# Patient Record
Sex: Female | Born: 1935 | Race: Black or African American | Hispanic: No | Smoking: Never smoker
Health system: Southern US, Community
[De-identification: ages and names within clinical notes are randomized; demographics above are authoritative.]

## PROBLEM LIST (undated history)

## (undated) DIAGNOSIS — E119 Type 2 diabetes mellitus without complications: Secondary | ICD-10-CM

## (undated) DIAGNOSIS — N289 Disorder of kidney and ureter, unspecified: Secondary | ICD-10-CM

## (undated) HISTORY — PX: STOMACH SURGERY: SHX791

## (undated) HISTORY — PX: CARDIAC SURGERY: SHX584

## (undated) HISTORY — DX: Type 2 diabetes mellitus without complications: E11.9

## (undated) HISTORY — DX: Disorder of kidney and ureter, unspecified: N28.9

## (undated) HISTORY — PX: BREAST SURGERY: SHX581

---

## 2003-04-13 ENCOUNTER — Other Ambulatory Visit: Payer: Self-pay

## 2010-04-21 DIAGNOSIS — N184 Chronic kidney disease, stage 4 (severe): Secondary | ICD-10-CM | POA: Insufficient documentation

## 2012-06-29 DIAGNOSIS — E1122 Type 2 diabetes mellitus with diabetic chronic kidney disease: Secondary | ICD-10-CM | POA: Insufficient documentation

## 2012-11-29 ENCOUNTER — Encounter: Payer: Self-pay | Admitting: Podiatry

## 2012-12-01 ENCOUNTER — Encounter: Payer: Self-pay | Admitting: Podiatry

## 2012-12-01 ENCOUNTER — Ambulatory Visit (INDEPENDENT_AMBULATORY_CARE_PROVIDER_SITE_OTHER): Payer: Medicare Other | Admitting: Podiatry

## 2012-12-01 VITALS — BP 154/67 | HR 65 | Resp 16 | Ht 66.0 in | Wt 213.0 lb

## 2012-12-01 DIAGNOSIS — M79609 Pain in unspecified limb: Secondary | ICD-10-CM

## 2012-12-01 DIAGNOSIS — B351 Tinea unguium: Secondary | ICD-10-CM

## 2012-12-01 NOTE — Progress Notes (Signed)
Illyana presents today with a chief complaint of painful E. elongated toenails.  Objective: Pulses remain palpable bilateral nails are thick yellow dystrophic clinically mycotic painful on palpation and ambulation.  Assessment: Pain in limb secondary to onychomycosis 1 through 5 bilateral.  Plan: Debridement of nails 1 through 5 bilateral is cover service secondary to pain followup with her in 3 months.

## 2013-03-02 ENCOUNTER — Ambulatory Visit (INDEPENDENT_AMBULATORY_CARE_PROVIDER_SITE_OTHER): Payer: Medicare Other | Admitting: Podiatry

## 2013-03-02 ENCOUNTER — Encounter: Payer: Self-pay | Admitting: Podiatry

## 2013-03-02 VITALS — BP 165/82 | HR 72 | Resp 16 | Ht 66.0 in | Wt 208.0 lb

## 2013-03-02 DIAGNOSIS — B351 Tinea unguium: Secondary | ICD-10-CM

## 2013-03-02 DIAGNOSIS — M79609 Pain in unspecified limb: Secondary | ICD-10-CM

## 2013-03-03 NOTE — Progress Notes (Signed)
Katrina Chambers presents today with a chief complaint of painful toenails one through 5 bilateral.  Objective: Vital signs are stable alert and oriented x3. Nails are thick yellow dystrophic lytic mycotic painful palpation as well as debridement.  Assessment: Pain in limb secondary to onychomycosis 1 through 5 bilateral.  Plan: Debridement of nails 1 through 5 bilateral is cover service secondary to pain.

## 2013-06-13 ENCOUNTER — Ambulatory Visit (INDEPENDENT_AMBULATORY_CARE_PROVIDER_SITE_OTHER): Payer: Medicare Other | Admitting: Podiatry

## 2013-06-13 ENCOUNTER — Encounter: Payer: Self-pay | Admitting: Podiatry

## 2013-06-13 DIAGNOSIS — B351 Tinea unguium: Secondary | ICD-10-CM

## 2013-06-13 DIAGNOSIS — M79609 Pain in unspecified limb: Secondary | ICD-10-CM

## 2013-06-13 NOTE — Progress Notes (Signed)
She presents today with a chief complaint of painful elongated toenails one through 5 bilateral.  Objective: Pulses are palpable bilateral. Nails are thick yellow dystrophic onychomycotic and painful palpation.  Assessment: Pain in limb secondary to onychomycosis 1 through 5 bilateral.  Plan: Debridement of nails 1 through 5 bilateral covered service secondary to pain.

## 2013-09-05 ENCOUNTER — Ambulatory Visit: Payer: Medicare Other | Admitting: Podiatry

## 2013-09-08 ENCOUNTER — Ambulatory Visit (INDEPENDENT_AMBULATORY_CARE_PROVIDER_SITE_OTHER): Payer: Medicare Other | Admitting: Podiatry

## 2013-09-08 DIAGNOSIS — M79609 Pain in unspecified limb: Secondary | ICD-10-CM

## 2013-09-08 DIAGNOSIS — M79676 Pain in unspecified toe(s): Secondary | ICD-10-CM

## 2013-09-08 DIAGNOSIS — B351 Tinea unguium: Secondary | ICD-10-CM

## 2013-09-08 NOTE — Patient Instructions (Signed)
Diabetes and Foot Care Diabetes may cause you to have problems because of poor blood supply (circulation) to your feet and legs. This may cause the skin on your feet to become thinner, break easier, and heal more slowly. Your skin may become dry, and the skin may peel and crack. You may also have nerve damage in your legs and feet causing decreased feeling in them. You may not notice minor injuries to your feet that could lead to infections or more serious problems. Taking care of your feet is one of the most important things you can do for yourself.  HOME CARE INSTRUCTIONS  Wear shoes at all times, even in the house. Do not go barefoot. Bare feet are easily injured.  Check your feet daily for blisters, cuts, and redness. If you cannot see the bottom of your feet, use a mirror or ask someone for help.  Wash your feet with warm water (do not use hot water) and mild soap. Then pat your feet and the areas between your toes until they are completely dry. Do not soak your feet as this can dry your skin.  Apply a moisturizing lotion or petroleum jelly (that does not contain alcohol and is unscented) to the skin on your feet and to dry, brittle toenails. Do not apply lotion between your toes.  Trim your toenails straight across. Do not dig under them or around the cuticle. File the edges of your nails with an emery board or nail file.  Do not cut corns or calluses or try to remove them with medicine.  Wear clean socks or stockings every day. Make sure they are not too tight. Do not wear knee-high stockings since they may decrease blood flow to your legs.  Wear shoes that fit properly and have enough cushioning. To break in new shoes, wear them for just a few hours a day. This prevents you from injuring your feet. Always look in your shoes before you put them on to be sure there are no objects inside.  Do not cross your legs. This may decrease the blood flow to your feet.  If you find a minor scrape,  cut, or break in the skin on your feet, keep it and the skin around it clean and dry. These areas may be cleansed with mild soap and water. Do not cleanse the area with peroxide, alcohol, or iodine.  When you remove an adhesive bandage, be sure not to damage the skin around it.  If you have a wound, look at it several times a day to make sure it is healing.  Do not use heating pads or hot water bottles. They may burn your skin. If you have lost feeling in your feet or legs, you may not know it is happening until it is too late.  Make sure your health care provider performs a complete foot exam at least annually or more often if you have foot problems. Report any cuts, sores, or bruises to your health care provider immediately. SEEK MEDICAL CARE IF:   You have an injury that is not healing.  You have cuts or breaks in the skin.  You have an ingrown nail.  You notice redness on your legs or feet.  You feel burning or tingling in your legs or feet.  You have pain or cramps in your legs and feet.  Your legs or feet are numb.  Your feet always feel cold. SEEK IMMEDIATE MEDICAL CARE IF:   There is increasing redness,   swelling, or pain in or around a wound.  There is a red line that goes up your leg.  Pus is coming from a wound.  You develop a fever or as directed by your health care provider.  You notice a bad smell coming from an ulcer or wound. Document Released: 12/21/1999 Document Revised: 08/25/2012 Document Reviewed: 06/01/2012 Exit[employer] Patient Information 2015 [employer]. This information is not intended to replace advice given to you by your health care provider. Make sure you discuss any questions you have with your health care provider.  

## 2013-09-08 NOTE — Progress Notes (Signed)
Patient ID: TRISH HETU, female   DOB: 25-Apr-1934, 79 y.o.   MRN: [id number]  Subjective: 79 year old female returns the office today for diabetic risk assessment and for painful elongated nails. She denies any tingling or numbness her feet, denies claudication symptoms, nice any recent ulceration. States that her last blood sugar was 128 this morning. No other complaints at this time.  Objective: AAO x3, NAD DP/PT pulses palpable b/l. CRT < 3sec Protective sensation intact the Semmes Weinstein monofilament, vibratory sensation intact, Achilles tendon reflex intact. Nails hypertrophic, dystrophic, elongated, discolored x10.  No open lesions, no pre-ulcer lesions. ROM WNL  Assessment: 79 year old female with symptomatic onychomycosis  Plan: -Treatment options discussed with the patient including alternatives, risks, complications. -Nails are sharply debrided [id number] without complications. -Discussed the importance of daily foot inspection. -Followup in 3 months or sooner if any problems are to arise or any changes symptoms. Call with any questions or concerns.

## 2013-12-08 ENCOUNTER — Ambulatory Visit (INDEPENDENT_AMBULATORY_CARE_PROVIDER_SITE_OTHER): Payer: Medicare Other | Admitting: Podiatry

## 2013-12-08 DIAGNOSIS — M79676 Pain in unspecified toe(s): Secondary | ICD-10-CM

## 2013-12-08 DIAGNOSIS — B351 Tinea unguium: Secondary | ICD-10-CM

## 2013-12-08 NOTE — Patient Instructions (Signed)
Diabetes and Foot Care Diabetes may cause you to have problems because of poor blood supply (circulation) to your feet and legs. This may cause the skin on your feet to become thinner, break easier, and heal more slowly. Your skin may become dry, and the skin may peel and crack. You may also have nerve damage in your legs and feet causing decreased feeling in them. You may not notice minor injuries to your feet that could lead to infections or more serious problems. Taking care of your feet is one of the most important things you can do for yourself.  HOME CARE INSTRUCTIONS  Wear shoes at all times, even in the house. Do not go barefoot. Bare feet are easily injured.  Check your feet daily for blisters, cuts, and redness. If you cannot see the bottom of your feet, use a mirror or ask someone for help.  Wash your feet with warm water (do not use hot water) and mild soap. Then pat your feet and the areas between your toes until they are completely dry. Do not soak your feet as this can dry your skin.  Apply a moisturizing lotion or petroleum jelly (that does not contain alcohol and is unscented) to the skin on your feet and to dry, brittle toenails. Do not apply lotion between your toes.  Trim your toenails straight across. Do not dig under them or around the cuticle. File the edges of your nails with an emery board or nail file.  Do not cut corns or calluses or try to remove them with medicine.  Wear clean socks or stockings every day. Make sure they are not too tight. Do not wear knee-high stockings since they may decrease blood flow to your legs.  Wear shoes that fit properly and have enough cushioning. To break in new shoes, wear them for just a few hours a day. This prevents you from injuring your feet. Always look in your shoes before you put them on to be sure there are no objects inside.  Do not cross your legs. This may decrease the blood flow to your feet.  If you find a minor scrape,  cut, or break in the skin on your feet, keep it and the skin around it clean and dry. These areas may be cleansed with mild soap and water. Do not cleanse the area with peroxide, alcohol, or iodine.  When you remove an adhesive bandage, be sure not to damage the skin around it.  If you have a wound, look at it several times a day to make sure it is healing.  Do not use heating pads or hot water bottles. They may burn your skin. If you have lost feeling in your feet or legs, you may not know it is happening until it is too late.  Make sure your health care provider performs a complete foot exam at least annually or more often if you have foot problems. Report any cuts, sores, or bruises to your health care provider immediately. SEEK MEDICAL CARE IF:   You have an injury that is not healing.  You have cuts or breaks in the skin.  You have an ingrown nail.  You notice redness on your legs or feet.  You feel burning or tingling in your legs or feet.  You have pain or cramps in your legs and feet.  Your legs or feet are numb.  Your feet always feel cold. SEEK IMMEDIATE MEDICAL CARE IF:   There is increasing redness,   swelling, or pain in or around a wound.  There is a red line that goes up your leg.  Pus is coming from a wound.  You develop a fever or as directed by your health care provider.  You notice a bad smell coming from an ulcer or wound. Document Released: 12/21/1999 Document Revised: 08/25/2012 Document Reviewed: 06/01/2012 Exit[employer] Patient Information 2015 [employer]. This information is not intended to replace advice given to you by your health care provider. Make sure you discuss any questions you have with your health care provider.  

## 2013-12-08 NOTE — Progress Notes (Signed)
Patient ID: Katrina Chambers, female   DOB: 02/26/1934, 79 y.o.   MRN: [id number]  Subjective: 79 year old female returns the office today for diabetic risk assessment and for painful elongated toenails. She states the nails are painful particularly when wearing shoe gear. She does not number what her last blood sugar was. Denies any recent ulcerations. Denies any claudication symptoms or any tingling or numbness. No other complaints at this time. Denies any systemic complaints as fevers, chills, nausea, vomiting. No acute changes his last appointment.  Objective: AAO 3, NAD DP/PT pulses palpable bilaterally, CRT less than 3 seconds Protective sensation intact with Simms Weinstein monofilament, vibratory sensation intact, Achilles tendon reflex intact. Nails hypertrophic, dystrophic, elongated, brittle, discolored 10. There is no surrounding erythema or drainage. No clinical signs of infection. No open lesions or pre-ulcerative lesions. MMT 5/5, ROM WNL No pain with calf compression, swelling, warmth, erythema.  Assessment: 79 year old female with symptomatic onychomycosis.  Plan: -Treatment options were discussed including alternatives, risks, complications. -Nail sharply debrided 10 without complications. -Discussed the importance of daily foot inspection and call with any changes. -Follow-up in 3 months or sooner if any palms are to arise. In the meantime, call the office with any questions, concerns, change in symptoms.

## 2014-03-09 ENCOUNTER — Ambulatory Visit (INDEPENDENT_AMBULATORY_CARE_PROVIDER_SITE_OTHER): Payer: Medicare Other | Admitting: Podiatry

## 2014-03-09 DIAGNOSIS — B351 Tinea unguium: Secondary | ICD-10-CM

## 2014-03-09 DIAGNOSIS — M79676 Pain in unspecified toe(s): Secondary | ICD-10-CM

## 2014-03-09 NOTE — Progress Notes (Signed)
Patient ID: MAYAROSE BERRETTA, female   DOB: 02-Jan-1935, 80 y.o.   MRN: [id number]  Subjective: 80 y.o.-year-old female returns the office today for painful, elongated, thickened toenails. Denies any redness or drainage around the nails. Denies any acute changes since last appointment and no new complaints today. Denies any systemic complaints such as fevers, chills, nausea, vomiting.   Objective: AAO 3, NAD DP/PT pulses palpable, CRT less than 3 seconds Protective sensation intact with Simms Weinstein monofilament, Achilles tendon reflex intact.  Nails hypertrophic, dystrophic, elongated, brittle, discolored 10. There is tenderness to the nails 1-5 bilaterally. There is no surrounding erythema or drainage along the nail sites. No open lesions or pre-ulcerative lesions are identified. No other areas of tenderness bilateral lower extremities. No overlying edema, erythema, increased warmth. No pain with calf compression, swelling, warmth, erythema.  Assessment: Ms. Dai  presents with symptomatic onychomycosis  Plan: -Treatment options including alternatives, risks, complications were discussed -Nails sharply debrided 10 without complication/bleeding. -Discussed daily foot inspection. If there are any changes, to call the office immediately.  -Follow-up in 3 months or sooner if any problems are to arise. In the meantime, encouraged to call the office with any questions, concerns, changes symptoms.

## 2014-03-09 NOTE — Patient Instructions (Signed)
Diabetes and Foot Care Diabetes may cause you to have problems because of poor blood supply (circulation) to your feet and legs. This may cause the skin on your feet to become thinner, break easier, and heal more slowly. Your skin may become dry, and the skin may peel and crack. You may also have nerve damage in your legs and feet causing decreased feeling in them. You may not notice minor injuries to your feet that could lead to infections or more serious problems. Taking care of your feet is one of the most important things you can do for yourself.  HOME CARE INSTRUCTIONS  Wear shoes at all times, even in the house. Do not go barefoot. Bare feet are easily injured.  Check your feet daily for blisters, cuts, and redness. If you cannot see the bottom of your feet, use a mirror or ask someone for help.  Wash your feet with warm water (do not use hot water) and mild soap. Then pat your feet and the areas between your toes until they are completely dry. Do not soak your feet as this can dry your skin.  Apply a moisturizing lotion or petroleum jelly (that does not contain alcohol and is unscented) to the skin on your feet and to dry, brittle toenails. Do not apply lotion between your toes.  Trim your toenails straight across. Do not dig under them or around the cuticle. File the edges of your nails with an emery board or nail file.  Do not cut corns or calluses or try to remove them with medicine.  Wear clean socks or stockings every day. Make sure they are not too tight. Do not wear knee-high stockings since they may decrease blood flow to your legs.  Wear shoes that fit properly and have enough cushioning. To break in new shoes, wear them for just a few hours a day. This prevents you from injuring your feet. Always look in your shoes before you put them on to be sure there are no objects inside.  Do not cross your legs. This may decrease the blood flow to your feet.  If you find a minor scrape,  cut, or break in the skin on your feet, keep it and the skin around it clean and dry. These areas may be cleansed with mild soap and water. Do not cleanse the area with peroxide, alcohol, or iodine.  When you remove an adhesive bandage, be sure not to damage the skin around it.  If you have a wound, look at it several times a day to make sure it is healing.  Do not use heating pads or hot water bottles. They may burn your skin. If you have lost feeling in your feet or legs, you may not know it is happening until it is too late.  Make sure your health care provider performs a complete foot exam at least annually or more often if you have foot problems. Report any cuts, sores, or bruises to your health care provider immediately. SEEK MEDICAL CARE IF:   You have an injury that is not healing.  You have cuts or breaks in the skin.  You have an ingrown nail.  You notice redness on your legs or feet.  You feel burning or tingling in your legs or feet.  You have pain or cramps in your legs and feet.  Your legs or feet are numb.  Your feet always feel cold. SEEK IMMEDIATE MEDICAL CARE IF:   There is increasing redness,   swelling, or pain in or around a wound.  There is a red line that goes up your leg.  Pus is coming from a wound.  You develop a fever or as directed by your health care provider.  You notice a bad smell coming from an ulcer or wound. Document Released: 12/21/1999 Document Revised: 08/25/2012 Document Reviewed: 06/01/2012 Exit[employer] Patient Information 2015 [employer]. This information is not intended to replace advice given to you by your health care provider. Make sure you discuss any questions you have with your health care provider.  

## 2014-06-13 ENCOUNTER — Encounter: Payer: Self-pay | Admitting: Podiatry

## 2014-06-13 ENCOUNTER — Ambulatory Visit (INDEPENDENT_AMBULATORY_CARE_PROVIDER_SITE_OTHER): Payer: Medicare Other | Admitting: Podiatry

## 2014-06-13 DIAGNOSIS — M79676 Pain in unspecified toe(s): Secondary | ICD-10-CM | POA: Diagnosis not present

## 2014-06-13 DIAGNOSIS — B351 Tinea unguium: Secondary | ICD-10-CM

## 2014-06-13 NOTE — Progress Notes (Signed)
Patient ID: ODELIA SIGUR, female   DOB: Mar 08, 1934, 80 y.o.   MRN: [id number]  Subjective: 80 y.o.-year-old female returns the office today for painful, elongated, thickened toenails which she is unable to trim herself. Denies any redness or drainage around the nails. Denies any acute changes since last appointment and no new complaints today. Denies any systemic complaints such as fevers, chills, nausea, vomiting.   Objective: AAO 3, NAD DP/PT pulses palpable, CRT less than 3 seconds Protective sensation intact with Simms Weinstein monofilament, Achilles tendon reflex intact.  Nails hypertrophic, dystrophic, elongated, brittle, discolored 10. There is tenderness overlying these nails. There is no surrounding erythema or drainage along the nail sites. No open lesions or pre-ulcerative lesions are identified. No other areas of tenderness bilateral lower extremities. No overlying edema, erythema, increased warmth. No pain with calf compression, swelling, warmth, erythema.  Assessment: Patient presents with symptomatic onychomycosis  Plan: -Treatment options including alternatives, risks, complications were discussed -Nails sharply debrided 10 without complication/bleeding. -Discussed daily foot inspection. If there are any changes, to call the office immediately.  -Follow-up in 3 months or sooner if any problems are to arise. In the meantime, encouraged to call the office with any questions, concerns, changes symptoms.

## 2014-07-18 DIAGNOSIS — I25810 Atherosclerosis of coronary artery bypass graft(s) without angina pectoris: Secondary | ICD-10-CM | POA: Insufficient documentation

## 2014-09-14 ENCOUNTER — Ambulatory Visit: Payer: Medicare Other

## 2014-09-26 ENCOUNTER — Ambulatory Visit: Payer: Medicare Other

## 2014-10-05 ENCOUNTER — Ambulatory Visit (INDEPENDENT_AMBULATORY_CARE_PROVIDER_SITE_OTHER): Payer: Medicare Other | Admitting: Podiatry

## 2014-10-05 DIAGNOSIS — M79676 Pain in unspecified toe(s): Secondary | ICD-10-CM

## 2014-10-05 DIAGNOSIS — B351 Tinea unguium: Secondary | ICD-10-CM | POA: Diagnosis not present

## 2014-10-05 NOTE — Progress Notes (Signed)
Patient ID: Katrina Chambers, female   DOB: Jul 20, 1934, 80 y.o.   MRN: [id number]  Subjective: 80 y.o.-year-old female returns the office today for painful, elongated, thickened toenails which she is unable to trim herself. Denies any redness or drainage around the nails. Denies any acute changes since last appointment and no new complaints today. Denies any systemic complaints such as fevers, chills, nausea, vomiting.   Objective: AAO 3, NAD DP/PT pulses palpable, CRT less than 3 seconds Nails hypertrophic, dystrophic, elongated, brittle, discolored 10. There is tenderness overlying the nails 15 bilaterally. There is no surrounding erythema or drainage along the nail sites. No open lesions or pre-ulcerative lesions are identified. No other areas of tenderness bilateral lower extremities. No overlying edema, erythema, increased warmth. No pain with calf compression, swelling, warmth, erythema.  Assessment: Patient presents with symptomatic onychomycosis  Plan: -Treatment options including alternatives, risks, complications were discussed -Nails sharply debrided 10 without complication/bleeding. -Discussed daily foot inspection. If there are any changes, to call the office immediately.  -Follow-up in 3 months or sooner if any problems are to arise. In the meantime, encouraged to call the office with any questions, concerns, changes symptoms.  Celesta Gentile, DPM

## 2015-01-04 ENCOUNTER — Ambulatory Visit: Payer: Medicare Other

## 2015-01-05 ENCOUNTER — Ambulatory Visit (INDEPENDENT_AMBULATORY_CARE_PROVIDER_SITE_OTHER): Payer: Medicare Other | Admitting: Sports Medicine

## 2015-01-05 ENCOUNTER — Encounter: Payer: Self-pay | Admitting: Sports Medicine

## 2015-01-05 DIAGNOSIS — B351 Tinea unguium: Secondary | ICD-10-CM | POA: Diagnosis not present

## 2015-01-05 DIAGNOSIS — E119 Type 2 diabetes mellitus without complications: Secondary | ICD-10-CM

## 2015-01-05 DIAGNOSIS — M79676 Pain in unspecified toe(s): Secondary | ICD-10-CM | POA: Diagnosis not present

## 2015-01-06 NOTE — Progress Notes (Signed)
Patient ID: Katrina Chambers, female   DOB: [birth year], 80 y.o.   MRN: [id number] Subjective: Katrina Chambers is a 80 y.o. female patient with history of type 2 diabetes who presents to office today complaining of long, painful nails  while ambulating in shoes; unable to trim. Patient states that the glucose reading this morning was 138 mg/dl. Patient denies any new changes in medication or new problems. Patient denies any new cramping, numbness, burning or tingling in the legs.  There are no active problems to display for this patient.  Current Outpatient Prescriptions on File Prior to Visit  Medication Sig Dispense Refill  . albuterol (PROVENTIL HFA;VENTOLIN HFA) 108 (90 BASE) MCG/ACT inhaler Inhale 2 puffs into the lungs every 6 (six) hours as needed for wheezing or shortness of breath.    Marland Kitchen aspirin 81 MG tablet Take 81 mg by mouth daily.    . enalapril (VASOTEC) 20 MG tablet Take 20 mg by mouth 2 (two) times daily.    . ergocalciferol (VITAMIN D2) 50000 UNITS capsule Take 50,000 Units by mouth once a week.    . felodipine (PLENDIL) 10 MG 24 hr tablet Take 10 mg by mouth daily.    . Fluticasone-Salmeterol (ADVAIR) 250-50 MCG/DOSE AEPB Inhale 1 puff into the lungs 2 (two) times daily.    . hydrALAZINE (APRESOLINE) 10 MG tablet Take 10 mg by mouth 3 (three) times daily.    . insulin glargine (LANTUS) 100 UNIT/ML injection Inject 15 Units into the skin at bedtime.    . metoprolol tartrate (LOPRESSOR) 25 MG tablet Take 25 mg by mouth 2 (two) times daily.    Marland Kitchen NOVOLOG FLEXPEN 100 UNIT/ML FlexPen     . sevelamer (RENAGEL) 800 MG tablet Take 800 mg by mouth 3 (three) times daily with meals.    . simvastatin (ZOCOR) 20 MG tablet Take 20 mg by mouth daily.    Marland Kitchen spironolactone (ALDACTONE) 25 MG tablet Take 25 mg by mouth daily.    . SYMBICORT 160-4.5 MCG/ACT inhaler      No current facility-administered medications on file prior to visit.   No Known Allergies  Labs: HEMOGLOBIN A1C- No recent lab    Objective: General: Patient is awake, alert, and oriented x 3 and in no acute distress.  Integument: Skin is warm, dry and supple bilateral. Nails are tender, long, thickened and  dystrophic with subungual debris, consistent with onychomycosis, 1-5 bilateral. No signs of infection. No open lesions or preulcerative lesions present bilateral. Remaining integument unremarkable.  Vasculature:  Dorsalis Pedis pulse 2/4 bilateral. Posterior Tibial pulse 1/4 bilateral.  Capillary fill time <3 sec 1-5 bilateral. Scant hair growth to the level of the digits. Temperature gradient within normal limits. No varicosities present bilateral. No edema present bilateral.   Neurology: The patient has intact sensation measured with a 5.07/10g Semmes Weinstein Monofilament at all pedal sites bilateral . Vibratory sensation intact bilateral with tuning fork. No Babinski sign present bilateral.   Musculoskeletal: No gross pedal deformities noted bilateral. Muscular strength 5/5 in all lower extremity muscular groups bilateral without pain or limitation on range of motion . No tenderness with calf compression bilateral.  Assessment and Plan: Problem List Items Addressed This Visit    None    Visit Diagnoses    Pain of toe, unspecified laterality    -  Primary    Dermatophytosis of nail        Diabetes mellitus without complication ([place])          -  Examined patient. -Discussed and educated patient on diabetic foot care, especially with  regards to the vascular, neurological and musculoskeletal systems.  -Stressed the importance of good glycemic control and the detriment of not  controlling glucose levels in relation to the foot. -Mechanically debrided all nails 1-5 bilateral using sterile nail nipper and filed with dremel without incident  -Answered all patient questions -Patient to return in 3 months for at risk foot care -Patient advised to call the office if any problems or questions arise in the  meantime.  Katrina Chambers, DPM

## 2015-01-14 ENCOUNTER — Emergency Department
Admission: EM | Admit: 2015-01-14 | Discharge: 2015-01-14 | Disposition: A | Discharge status: Home / Self Care | Payer: Medicare Other | Attending: Emergency Medicine | Admitting: Emergency Medicine

## 2015-01-14 ENCOUNTER — Encounter: Payer: Self-pay | Admitting: Emergency Medicine

## 2015-01-14 DIAGNOSIS — S3992XA Unspecified injury of lower back, initial encounter: Secondary | ICD-10-CM | POA: Diagnosis not present

## 2015-01-14 DIAGNOSIS — Z79899 Other long term (current) drug therapy: Secondary | ICD-10-CM | POA: Diagnosis not present

## 2015-01-14 DIAGNOSIS — M79604 Pain in right leg: Secondary | ICD-10-CM

## 2015-01-14 DIAGNOSIS — Y998 Other external cause status: Secondary | ICD-10-CM | POA: Insufficient documentation

## 2015-01-14 DIAGNOSIS — S8991XA Unspecified injury of right lower leg, initial encounter: Secondary | ICD-10-CM | POA: Diagnosis present

## 2015-01-14 DIAGNOSIS — Y9289 Other specified places as the place of occurrence of the external cause: Secondary | ICD-10-CM | POA: Diagnosis not present

## 2015-01-14 DIAGNOSIS — Z7982 Long term (current) use of aspirin: Secondary | ICD-10-CM | POA: Diagnosis not present

## 2015-01-14 DIAGNOSIS — Z794 Long term (current) use of insulin: Secondary | ICD-10-CM | POA: Insufficient documentation

## 2015-01-14 DIAGNOSIS — E119 Type 2 diabetes mellitus without complications: Secondary | ICD-10-CM | POA: Insufficient documentation

## 2015-01-14 DIAGNOSIS — W010XXA Fall on same level from slipping, tripping and stumbling without subsequent striking against object, initial encounter: Secondary | ICD-10-CM | POA: Diagnosis not present

## 2015-01-14 DIAGNOSIS — Y9389 Activity, other specified: Secondary | ICD-10-CM | POA: Diagnosis not present

## 2015-01-14 DIAGNOSIS — Z7951 Long term (current) use of inhaled steroids: Secondary | ICD-10-CM | POA: Diagnosis not present

## 2015-01-14 DIAGNOSIS — M549 Dorsalgia, unspecified: Secondary | ICD-10-CM

## 2015-01-14 MED ORDER — NAPROXEN 500 MG PO TABS: 500.0000 mg | ORAL_TABLET | Freq: Two times a day (BID) | ORAL | Status: DC

## 2015-01-14 MED ORDER — DIAZEPAM 2 MG PO TABS: 2.0000 mg | ORAL_TABLET | Freq: Three times a day (TID) | ORAL | Status: AC | PRN

## 2015-01-14 NOTE — ED Notes (Signed)
E-signature box not working. Pt verbalized understanding of discharge instructions.

## 2015-01-14 NOTE — ED Notes (Signed)
Friend dropped off the pt - call dorothy poteat at [phone] to pick her up

## 2015-01-14 NOTE — ED Provider Notes (Signed)
[facility] Emergency Department Provider Note  ____________________________________________  Time seen: 3:45 PM  I have reviewed the triage vital signs and the nursing notes.   HISTORY  Chief Complaint Knee Pain    HPI Katrina Chambers is a 81 y.o. female who reports a mechanical fall 1 week ago where she slipped and fell on her back. Denies any pain at that time and no injuries. No head injuries or loss of consciousness, she's been ambulatory without difficulty since then but 2 days ago had gradual onset of right hip pain.     Past Medical History  Diagnosis Date  . Kidney disease   . Diabetes ([place])      There are no active problems to display for this patient.    Past Surgical History  Procedure Laterality Date  . Cardiac surgery    . Breast surgery Right   . Stomach surgery       Current Outpatient Rx  Name  Route  Sig  Dispense  Refill  . albuterol (PROVENTIL HFA;VENTOLIN HFA) 108 (90 BASE) MCG/ACT inhaler   Inhalation   Inhale 2 puffs into the lungs every 6 (six) hours as needed for wheezing or shortness of breath.         Marland Kitchen aspirin 81 MG tablet   Oral   Take 81 mg by mouth daily.         . diazepam (VALIUM) 2 MG tablet   Oral   Take 1 tablet (2 mg total) by mouth every 8 (eight) hours as needed for anxiety.   6 tablet   0   . enalapril (VASOTEC) 20 MG tablet   Oral   Take 20 mg by mouth 2 (two) times daily.         . ergocalciferol (VITAMIN D2) 50000 UNITS capsule   Oral   Take 50,000 Units by mouth once a week.         . felodipine (PLENDIL) 10 MG 24 hr tablet   Oral   Take 10 mg by mouth daily.         . Fluticasone-Salmeterol (ADVAIR) 250-50 MCG/DOSE AEPB   Inhalation   Inhale 1 puff into the lungs 2 (two) times daily.         . hydrALAZINE (APRESOLINE) 10 MG tablet   Oral   Take 10 mg by mouth 3 (three) times daily.         . insulin glargine (LANTUS) 100 UNIT/ML injection   Subcutaneous  Inject 15 Units into the skin at bedtime.         . metoprolol tartrate (LOPRESSOR) 25 MG tablet   Oral   Take 25 mg by mouth 2 (two) times daily.         . naproxen (NAPROSYN) 500 MG tablet   Oral   Take 1 tablet (500 mg total) by mouth 2 (two) times daily with a meal.   20 tablet   0   . NOVOLOG FLEXPEN 100 UNIT/ML FlexPen                 Dispense as written.   . sevelamer (RENAGEL) 800 MG tablet   Oral   Take 800 mg by mouth 3 (three) times daily with meals.         . simvastatin (ZOCOR) 20 MG tablet   Oral   Take 20 mg by mouth daily.         Marland Kitchen spironolactone (ALDACTONE) 25 MG tablet   Oral  Take 25 mg by mouth daily.         Dellis Anes 160-4.5 MCG/ACT inhaler                 Dispense as written.      Allergies Review of patient's allergies indicates no known allergies.   History reviewed. No pertinent family history.  Social History Social History  Substance Use Topics  . Smoking status: Never Smoker   . Smokeless tobacco: Never Used  . Alcohol Use: No    Review of Systems  Constitutional:   No fever or chills. No weight changes Eyes:   No blurry vision or double vision.  ENT:   No sore throat. Cardiovascular:   No chest pain. Respiratory:   No dyspnea or cough. Gastrointestinal:   Negative for abdominal pain, vomiting and diarrhea.  No BRBPR or melena. Genitourinary:   Negative for dysuria, urinary retention, bloody urine, or difficulty urinating. Musculoskeletal:   Negative for back pain. Pain in right hip Skin:   Negative for rash. Neurological:   Negative for headaches, focal weakness or numbness. Psychiatric:  No anxiety or depression.   Endocrine:  No hot/cold intolerance, changes in energy, or sleep difficulty.  10-point ROS otherwise negative.  ____________________________________________   PHYSICAL EXAM:  VITAL SIGNS: ED Triage Vitals  Enc Vitals Group     BP 01/14/15 1535 182/79 mmHg     Pulse --       Resp 01/14/15 1532 18     Temp 01/14/15 1532 98.1 F (36.7 C)     Temp src --      SpO2 01/14/15 1532 96 %     Weight 01/14/15 1532 202 lb (91.627 kg)     Height 01/14/15 1532 5\' 6"  (1.676 m)     Head Cir --      Peak Flow --      Pain Score 01/14/15 1542 4     Pain Loc --      Pain Edu? --      Excl. in [place]? --     Vital signs reviewed, nursing assessments reviewed.   Constitutional:   Alert and oriented. Well appearing and in no distress. Eyes:   No scleral icterus. No conjunctival pallor. PERRL. EOMI ENT   Head:   Normocephalic and atraumatic.   Nose:   No congestion/rhinnorhea. No septal hematoma   Mouth/Throat:   MMM, no pharyngeal erythema. No peritonsillar mass. No uvula shift.   Neck:   No stridor. No SubQ emphysema. No meningismus. Hematological/Lymphatic/Immunilogical:   No cervical lymphadenopathy. Cardiovascular:   RRR. Normal and symmetric distal pulses are present in all extremities. No murmurs, rubs, or gallops. Respiratory:   Normal respiratory effort without tachypnea nor retractions. Breath sounds are clear and equal bilaterally. No wheezes/rales/rhonchi. Gastrointestinal:   Soft and nontender. No distention. There is no CVA tenderness.  No rebound, rigidity, or guarding. Genitourinary:   deferred Musculoskeletal:   Nontender with normal range of motion in all extremities. No joint effusions.  No lower extremity tenderness.  No edema. No midline spinal tenderness. Straight leg raise negative bilaterally. There is tenderness in the soft tissues of the right lower back over the sacroiliac joint area without any instability or bony point tenderness. No pain with hip flexion or knee flexion. No instability, no tenderness of the bones. Neurologic:   Normal speech and language.  CN 2-10 normal. Motor grossly intact. No pronator drift.  Normal gait. No gross focal neurologic deficits are appreciated.  Skin:  Skin is warm, dry and intact. No rash noted.  No  petechiae, purpura, or bullae. Psychiatric:   Mood and affect are normal. Speech and behavior are normal. Patient exhibits appropriate insight and judgment.  ____________________________________________    LABS (pertinent positives/negatives) (all labs ordered are listed, but only abnormal results are displayed) Labs Reviewed - No data to display ____________________________________________   EKG    ____________________________________________    RADIOLOGY  Not indicated  ____________________________________________   PROCEDURES   ____________________________________________   INITIAL IMPRESSION / ASSESSMENT AND PLAN / ED COURSE  Pertinent labs & imaging results that were available during my care of the patient were reviewed by me and considered in my medical decision making (see chart for details).  Patient well appearing no acute distress with a musculoskeletal complaint. No bony findings on exam. Looked very low suspicion for fracture or dislocation or any vascular injury. This appears to be a low back muscle strain. We'll treat with naproxen, low dose when necessary Valium for muscle relaxation and follow up with primary care. Low suspicion for UTI pyelonephritis stone or AAA or dissection.     ____________________________________________   FINAL CLINICAL IMPRESSION(S) / ED DIAGNOSES  Final diagnoses:  Pain of back and right lower extremity      Carrie Mew, MD 01/14/15 [phone]

## 2015-01-14 NOTE — ED Notes (Signed)
[address] one week ago, knee started hurting 2 days ago

## 2015-01-14 NOTE — Discharge Instructions (Signed)
You were prescribed a medication that is potentially sedating. Do not drink alcohol, drive or participate in any other potentially dangerous activities while taking this medication as it may make you sleepy. Do not take this medication with any other sedating medications, either prescription or over-the-counter. If you were prescribed Percocet or Vicodin, do not take these with acetaminophen (Tylenol) as it is already contained within these medications.   Opioid pain medications (or "narcotics") can be habit forming.  Use it as little as possible to achieve adequate pain control.  Do not use or use it with extreme caution if you have a history of opiate abuse or dependence.  If you are on a pain contract with your primary care doctor or a pain specialist, be sure to let them know you were prescribed this medication today from the [facility].  This medication is intended for your use only - do not give any to anyone else and keep it in a secure place where nobody else, especially children and pets, have access to it.  It will also cause or worsen constipation, so you may want to consider taking an over-the-counter stool softener while you are taking this medication.  Back Pain, Adult Back pain is very common in adults.The cause of back pain is rarely dangerous and the pain often gets better over time.The cause of your back pain may not be known. Some common causes of back pain include:  Strain of the muscles or ligaments supporting the spine.  Wear and tear (degeneration) of the spinal disks.  Arthritis.  Direct injury to the back. For many people, back pain may return. Since back pain is rarely dangerous, most people can learn to manage this condition on their own. HOME CARE INSTRUCTIONS Watch your back pain for any changes. The following actions may help to lessen any discomfort you are feeling:  Remain active. It is stressful on your back to sit or stand in one place  for long periods of time. Do not sit, drive, or stand in one place for more than 30 minutes at a time. Take short walks on even surfaces as soon as you are able.Try to increase the length of time you walk each day.  Exercise regularly as directed by your health care provider. Exercise helps your back heal faster. It also helps avoid future injury by keeping your muscles strong and flexible.  Do not stay in bed.Resting more than 1-2 days can delay your recovery.  Pay attention to your body when you bend and lift. The most comfortable positions are those that put less stress on your recovering back. Always use proper lifting techniques, including:  Bending your knees.  Keeping the load close to your body.  Avoiding twisting.  Find a comfortable position to sleep. Use a firm mattress and lie on your side with your knees slightly bent. If you lie on your back, put a pillow under your knees.  Avoid feeling anxious or stressed.Stress increases muscle tension and can worsen back pain.It is important to recognize when you are anxious or stressed and learn ways to manage it, such as with exercise.  Take medicines only as directed by your health care provider. Over-the-counter medicines to reduce pain and inflammation are often the most helpful.Your health care provider may prescribe muscle relaxant drugs.These medicines help dull your pain so you can more quickly return to your normal activities and healthy exercise.  Apply ice to the injured area:  Put ice in a plastic  bag.  Place a towel between your skin and the bag.  Leave the ice on for 20 minutes, 2-3 times a day for the first 2-3 days. After that, ice and heat may be alternated to reduce pain and spasms.  Maintain a healthy weight. Excess weight puts extra stress on your back and makes it difficult to maintain good posture. SEEK MEDICAL CARE IF:  You have pain that is not relieved with rest or medicine.  You have increasing pain  going down into the legs or buttocks.  You have pain that does not improve in one week.  You have night pain.  You lose weight.  You have a fever or chills. SEEK IMMEDIATE MEDICAL CARE IF:   You develop new bowel or bladder control problems.  You have unusual weakness or numbness in your arms or legs.  You develop nausea or vomiting.  You develop abdominal pain.  You feel faint.   This information is not intended to replace advice given to you by your health care provider. Make sure you discuss any questions you have with your health care provider.   Document Released: 12/23/2004 Document Revised: 01/13/2014 Document Reviewed: 04/26/2013 Elsevier Interactive Patient Education 2016 [employer]  Musculoskeletal Pain Musculoskeletal pain is muscle and boney aches and pains. These pains can occur in any part of the body. Your caregiver may treat you without knowing the cause of the pain. They may treat you if blood or urine tests, X-rays, and other tests were normal.  CAUSES There is often not a definite cause or reason for these pains. These pains may be caused by a type of germ (virus). The discomfort may also come from overuse. Overuse includes working out too hard when your body is not fit. Boney aches also come from weather changes. Bone is sensitive to atmospheric pressure changes. HOME CARE INSTRUCTIONS   Ask when your test results will be ready. Make sure you get your test results.  Only take over-the-counter or prescription medicines for pain, discomfort, or fever as directed by your caregiver. If you were given medications for your condition, do not drive, operate machinery or power tools, or sign legal documents for 24 hours. Do not drink alcohol. Do not take sleeping pills or other medications that may interfere with treatment.  Continue all activities unless the activities cause more pain. When the pain lessens, slowly resume normal activities. Gradually increase the  intensity and duration of the activities or exercise.  During periods of severe pain, bed rest may be helpful. Lay or sit in any position that is comfortable.  Putting ice on the injured area.  Put ice in a bag.  Place a towel between your skin and the bag.  Leave the ice on for 15 to 20 minutes, 3 to 4 times a day.  Follow up with your caregiver for continued problems and no reason can be found for the pain. If the pain becomes worse or does not go away, it may be necessary to repeat tests or do additional testing. Your caregiver may need to look further for a possible cause. SEEK IMMEDIATE MEDICAL CARE IF:  You have pain that is getting worse and is not relieved by medications.  You develop chest pain that is associated with shortness or breath, sweating, feeling sick to your stomach (nauseous), or throw up (vomit).  Your pain becomes localized to the abdomen.  You develop any new symptoms that seem different or that concern you. MAKE SURE YOU:  Understand these instructions.  Will watch your condition.  Will get help right away if you are not doing well or get worse.   This information is not intended to replace advice given to you by your health care provider. Make sure you discuss any questions you have with your health care provider.   Document Released: 12/23/2004 Document Revised: 03/17/2011 Document Reviewed: 08/27/2012 Elsevier Interactive Patient Education [employer].

## 2015-04-06 ENCOUNTER — Ambulatory Visit (INDEPENDENT_AMBULATORY_CARE_PROVIDER_SITE_OTHER): Payer: Medicare Other | Admitting: Sports Medicine

## 2015-04-06 ENCOUNTER — Encounter: Payer: Self-pay | Admitting: Sports Medicine

## 2015-04-06 DIAGNOSIS — M79676 Pain in unspecified toe(s): Secondary | ICD-10-CM | POA: Diagnosis not present

## 2015-04-06 DIAGNOSIS — B351 Tinea unguium: Secondary | ICD-10-CM | POA: Diagnosis not present

## 2015-04-06 DIAGNOSIS — E119 Type 2 diabetes mellitus without complications: Secondary | ICD-10-CM | POA: Diagnosis not present

## 2015-04-06 NOTE — Progress Notes (Signed)
Patient ID: Katrina Chambers, female   DOB: Jun 13, 1934, 81 y.o.   MRN: [id number] Subjective: Katrina Chambers is a 81 y.o. female patient with history of type 2 diabetes who presents to office today complaining of long, painful nails  while ambulating in shoes; unable to trim. Patient states that the glucose reading this morning was 143 mg/dl. Patient denies any new changes in medication. Admits that she fell and hurt her right knee and is having that evaluated Other than that, no other problems or issues. Patient denies any new cramping, numbness, burning or tingling in the legs.  Patient Active Problem List   Diagnosis Date Noted  . Coronary artery abnormality 07/18/2014  . Type 2 diabetes mellitus ([place]) 06/29/2012  . Chronic kidney disease, stage IV (severe) ([place]) 04/21/2010  . Asthma, moderate persistent 05/02/2003  . Benign hypertension 02/07/2003   Current Outpatient Prescriptions on File Prior to Visit  Medication Sig Dispense Refill  . albuterol (PROVENTIL HFA;VENTOLIN HFA) 108 (90 BASE) MCG/ACT inhaler Inhale 2 puffs into the lungs every 6 (six) hours as needed for wheezing or shortness of breath.    Marland Kitchen aspirin 81 MG tablet Take 81 mg by mouth daily.    . diazepam (VALIUM) 2 MG tablet Take 1 tablet (2 mg total) by mouth every 8 (eight) hours as needed for anxiety. 6 tablet 0  . enalapril (VASOTEC) 20 MG tablet Take 20 mg by mouth 2 (two) times daily.    . ergocalciferol (VITAMIN D2) 50000 UNITS capsule Take 50,000 Units by mouth once a week.    . felodipine (PLENDIL) 10 MG 24 hr tablet Take 10 mg by mouth daily.    . Fluticasone-Salmeterol (ADVAIR) 250-50 MCG/DOSE AEPB Inhale 1 puff into the lungs 2 (two) times daily.    . hydrALAZINE (APRESOLINE) 10 MG tablet Take 10 mg by mouth 3 (three) times daily.    . insulin glargine (LANTUS) 100 UNIT/ML injection Inject 15 Units into the skin at bedtime.    . metoprolol tartrate (LOPRESSOR) 25 MG tablet Take 25 mg by mouth 2 (two) times daily.     . naproxen (NAPROSYN) 500 MG tablet Take 1 tablet (500 mg total) by mouth 2 (two) times daily with a meal. 20 tablet 0  . NOVOLOG FLEXPEN 100 UNIT/ML FlexPen     . sevelamer (RENAGEL) 800 MG tablet Take 800 mg by mouth 3 (three) times daily with meals.    . simvastatin (ZOCOR) 20 MG tablet Take 20 mg by mouth daily.    Marland Kitchen spironolactone (ALDACTONE) 25 MG tablet Take 25 mg by mouth daily.    . SYMBICORT 160-4.5 MCG/ACT inhaler      No current facility-administered medications on file prior to visit.   No Known Allergies   Objective: General: Patient is awake, alert, and oriented x 3 and in no acute distress.  Integument: Skin is warm, dry and supple bilateral. Nails are tender, long, thickened and  dystrophic with subungual debris, consistent with onychomycosis, 1-5 bilateral. No signs of infection. No open lesions or preulcerative lesions present bilateral. Remaining integument unremarkable.  Vasculature:  Dorsalis Pedis pulse 2/4 bilateral. Posterior Tibial pulse 1/4 bilateral.  Capillary fill time <3 sec 1-5 bilateral. Scant hair growth to the level of the digits. Temperature gradient within normal limits. No varicosities present bilateral. No edema present bilateral.   Neurology: The patient has intact sensation measured with a 5.07/10g Semmes Weinstein Monofilament at all pedal sites bilateral . Vibratory sensation intact bilateral with tuning fork. No  Babinski sign present bilateral.   Musculoskeletal: No gross pedal deformities noted bilateral. Muscular strength 5/5 in all lower extremity muscular groups bilateral without pain or limitation on range of motion . No tenderness with calf compression bilateral.  Assessment and Plan: Problem List Items Addressed This Visit    None    Visit Diagnoses    Pain of toe, unspecified laterality    -  Primary    Dermatophytosis of nail        Diabetes mellitus without complication ([place])          -Examined patient. -Discussed and  educated patient on diabetic foot care, especially with  regards to the vascular, neurological and musculoskeletal systems.  -Stressed the importance of good glycemic control and the detriment of not  controlling glucose levels in relation to the foot. -Mechanically debrided all nails 1-5 bilateral using sterile nail nipper and filed with dremel without incident  -Answered all patient questions -Patient to return in 3 months for at risk foot care -Patient advised to call the office if any problems or questions arise in the meantime.  Landis Martins, DPM

## 2015-04-30 ENCOUNTER — Telehealth: Payer: Self-pay | Admitting: *Deleted

## 2015-04-30 NOTE — Telephone Encounter (Signed)
Patient called stating she received a bill from [facility] and wanted to talk to someone about it.

## 2015-07-06 ENCOUNTER — Ambulatory Visit: Payer: Medicare Other | Admitting: Sports Medicine

## 2015-07-20 ENCOUNTER — Ambulatory Visit: Payer: Medicare Other | Admitting: Sports Medicine

## 2015-08-03 ENCOUNTER — Ambulatory Visit: Payer: Medicare Other | Admitting: Sports Medicine

## 2015-08-17 ENCOUNTER — Encounter: Payer: Self-pay | Admitting: Sports Medicine

## 2015-08-17 ENCOUNTER — Ambulatory Visit (INDEPENDENT_AMBULATORY_CARE_PROVIDER_SITE_OTHER): Payer: Medicare Other | Admitting: Sports Medicine

## 2015-08-17 DIAGNOSIS — E119 Type 2 diabetes mellitus without complications: Secondary | ICD-10-CM | POA: Diagnosis not present

## 2015-08-17 DIAGNOSIS — B351 Tinea unguium: Secondary | ICD-10-CM | POA: Diagnosis not present

## 2015-08-17 DIAGNOSIS — M79676 Pain in unspecified toe(s): Secondary | ICD-10-CM | POA: Diagnosis not present

## 2015-08-17 NOTE — Progress Notes (Signed)
Patient ID: Katrina Chambers, female   DOB: 09-17-1934, 81 y.o.   MRN: [id number] Subjective: Katrina Chambers is a 81 y.o. female patient with history of type 2 diabetes who presents to office today complaining of long, painful nails  while ambulating in shoes; unable to trim. Patient states that the glucose reading this morning was 127mg /dl. Patient denies any new changes in medication. Patient denies any new cramping, numbness, burning or tingling in the legs.  Patient Active Problem List   Diagnosis Date Noted  . Coronary artery abnormality 07/18/2014  . Type 2 diabetes mellitus ([place]) 06/29/2012  . Chronic kidney disease, stage IV (severe) ([place]) 04/21/2010  . Asthma, moderate persistent 05/02/2003  . Benign hypertension 02/07/2003   Current Outpatient Prescriptions on File Prior to Visit  Medication Sig Dispense Refill  . albuterol (PROVENTIL HFA;VENTOLIN HFA) 108 (90 BASE) MCG/ACT inhaler Inhale 2 puffs into the lungs every 6 (six) hours as needed for wheezing or shortness of breath.    Marland Kitchen aspirin 81 MG tablet Take 81 mg by mouth daily.    . diazepam (VALIUM) 2 MG tablet Take 1 tablet (2 mg total) by mouth every 8 (eight) hours as needed for anxiety. 6 tablet 0  . enalapril (VASOTEC) 20 MG tablet Take 20 mg by mouth 2 (two) times daily.    . ergocalciferol (VITAMIN D2) 50000 UNITS capsule Take 50,000 Units by mouth once a week.    . felodipine (PLENDIL) 10 MG 24 hr tablet Take 10 mg by mouth daily.    . Fluticasone-Salmeterol (ADVAIR) 250-50 MCG/DOSE AEPB Inhale 1 puff into the lungs 2 (two) times daily.    . hydrALAZINE (APRESOLINE) 10 MG tablet Take 10 mg by mouth 3 (three) times daily.    . insulin glargine (LANTUS) 100 UNIT/ML injection Inject 15 Units into the skin at bedtime.    . metoprolol tartrate (LOPRESSOR) 25 MG tablet Take 25 mg by mouth 2 (two) times daily.    . naproxen (NAPROSYN) 500 MG tablet Take 1 tablet (500 mg total) by mouth 2 (two) times daily with a meal. 20 tablet 0   . NOVOLOG FLEXPEN 100 UNIT/ML FlexPen     . sevelamer (RENAGEL) 800 MG tablet Take 800 mg by mouth 3 (three) times daily with meals.    . simvastatin (ZOCOR) 20 MG tablet Take 20 mg by mouth daily.    Marland Kitchen spironolactone (ALDACTONE) 25 MG tablet Take 25 mg by mouth daily.    . SYMBICORT 160-4.5 MCG/ACT inhaler      No current facility-administered medications on file prior to visit.    No Known Allergies   Objective: General: Patient is awake, alert, and oriented x 3 and in no acute distress.  Integument: Skin is warm, dry and supple bilateral. Nails are tender, long, thickened and dystrophic with subungual debris, consistent with onychomycosis, 1-5 bilateral. No signs of infection. No open lesions or preulcerative lesions present bilateral. Remaining integument unremarkable.  Vasculature:  Dorsalis Pedis pulse 2/4 bilateral. Posterior Tibial pulse 1/4 bilateral.  Capillary fill time <3 sec 1-5 bilateral. Scant hair growth to the level of the digits. Temperature gradient within normal limits. No varicosities present bilateral. No edema present bilateral.   Neurology: The patient has intact sensation measured with a 5.07/10g Semmes Weinstein Monofilament at all pedal sites bilateral . Vibratory sensation intact bilateral with tuning fork. No Babinski sign present bilateral.   Musculoskeletal: No gross pedal deformities noted bilateral. Muscular strength 5/5 in all lower extremity muscular groups bilateral  without pain or limitation on range of motion . No tenderness with calf compression bilateral.  Assessment and Plan: Problem List Items Addressed This Visit    None    Visit Diagnoses    Pain of toe, unspecified laterality    -  Primary   Dermatophytosis of nail       Diabetes mellitus without complication ([place])         -Examined patient. -Discussed and educated patient on diabetic foot care, especially with regards to the vascular, neurological and musculoskeletal systems.   -Stressed the importance of good glycemic control and the detriment of not controlling glucose levels in relation to the foot. -Mechanically debrided all nails 1-5 bilateral using sterile nail nipper and filed with dremel without incident  -Answered all patient questions -Patient to return in 3 months for at risk foot care -Patient advised to call the office if any problems or questions arise in the meantime.  Landis Martins, DPM

## 2015-11-06 DIAGNOSIS — Z789 Other specified health status: Secondary | ICD-10-CM | POA: Insufficient documentation

## 2015-11-23 ENCOUNTER — Ambulatory Visit: Payer: Medicare Other | Admitting: Podiatry

## 2015-12-03 ENCOUNTER — Ambulatory Visit (INDEPENDENT_AMBULATORY_CARE_PROVIDER_SITE_OTHER): Payer: Medicare Other | Admitting: Podiatry

## 2015-12-03 ENCOUNTER — Encounter: Payer: Self-pay | Admitting: Podiatry

## 2015-12-03 VITALS — Ht 66.0 in | Wt 208.0 lb

## 2015-12-03 DIAGNOSIS — E119 Type 2 diabetes mellitus without complications: Secondary | ICD-10-CM

## 2015-12-03 DIAGNOSIS — B351 Tinea unguium: Secondary | ICD-10-CM | POA: Diagnosis not present

## 2015-12-03 DIAGNOSIS — M79676 Pain in unspecified toe(s): Secondary | ICD-10-CM | POA: Diagnosis not present

## 2015-12-03 NOTE — Progress Notes (Signed)
Complaint:  Visit Type: Patient returns to my office for continued preventative foot care services. Complaint: Patient states" my nails have grown long and thick and become painful to walk and wear shoes" Patient has been diagnosed with DM with no foot complications. The patient presents for preventative foot care services. No changes to ROS  Podiatric Exam: Vascular: dorsalis pedis and posterior tibial pulses are palpable bilateral. Capillary return is immediate. Temperature gradient is WNL. Skin turgor WNL  Sensorium: Normal Semmes Weinstein monofilament test. Normal tactile sensation bilaterally. Nail Exam: Pt has thick disfigured discolored nails with subungual debris noted bilateral entire nail hallux through fifth toenails Ulcer Exam: There is no evidence of ulcer or pre-ulcerative changes or infection. Orthopedic Exam: Muscle tone and strength are WNL. No limitations in general ROM. No crepitus or effusions noted. Foot type and digits show no abnormalities. Bony prominences are unremarkable. Skin: No Porokeratosis. No infection or ulcers  Diagnosis:  Onychomycosis, , Pain in right toe, pain in left toes  Treatment & Plan Procedures and Treatment: Consent by patient was obtained for treatment procedures. The patient understood the discussion of treatment and procedures well. All questions were answered thoroughly reviewed. Debridement of mycotic and hypertrophic toenails, 1 through 5 bilateral and clearing of subungual debris. No ulceration, no infection noted.  Return Visit-Office Procedure: Patient instructed to return to the office for a follow up visit 3 months for continued evaluation and treatment.    Latysha Thackston DPM 

## 2016-01-30 ENCOUNTER — Ambulatory Visit: Payer: Medicare Other

## 2016-01-30 ENCOUNTER — Encounter: Payer: Self-pay | Admitting: *Deleted

## 2016-01-30 ENCOUNTER — Ambulatory Visit
Admission: EM | Admit: 2016-01-30 | Discharge: 2016-01-30 | Disposition: A | Discharge status: Home / Self Care | Payer: Medicare Other | Attending: Family Medicine | Admitting: Family Medicine

## 2016-01-30 DIAGNOSIS — M25532 Pain in left wrist: Secondary | ICD-10-CM

## 2016-01-30 DIAGNOSIS — M79642 Pain in left hand: Secondary | ICD-10-CM | POA: Diagnosis not present

## 2016-01-30 NOTE — Discharge Instructions (Signed)
Take medication as prescribed. Rest. Drink plenty of fluids. Apply ice. Wear splint as long as needed for pain.    Follow up with your primary care physician or orthopedic this week as needed. Return to Urgent care for new or worsening concerns.

## 2016-01-30 NOTE — ED Triage Notes (Signed)
Patient injured her left hand during a fall 5 days ago. No history of previous left hand injuries.

## 2016-01-30 NOTE — ED Provider Notes (Signed)
[facility] ____________________________________________  Time seen: Approximately 2:16 PM  I have reviewed the triage vital signs and the nursing notes.  HISTORY  Chief Complaint Hand Pain  HPI Katrina Chambers is a 82 y.o. female presenting with son at bedside for the complaints of left hand and left wrist pain. Patient reports this past Friday she was sitting on her couch, went to stand up and feels that she did not have her feet underneath her well, and fell forward. Patient states that she caught herself with her hands and has had pain to her left wrist since. Patient states that she feels the reason for her fall was because she got up too quickly and did not have her feet ready, and stood up with feet twisted beneath her. Patient denies any syncope, near-syncope, chest pain, shortness of breath, dizziness or other reasons for fall. Denies head injury or loss of consciousness. Patient reports left hand and left wrist pain is currently somewhat improved than initial, but concerned her as it has persisted. Reports has not been taking any medications for the same complaint. Reports right hand dominant. Denies any other pain or injury. Reports has a history of occasional falls at home due to being unsteady on her feet or quick movements.  Denies chest pain, shortness of breath, abdominal pain, dysuria, extremity pain, extremity swelling or rash. Denies recent sickness. Denies recent antibiotic use.   Charlton Amor, MD: PCP   Past Medical History:  Diagnosis Date  . Diabetes ([place])   . Kidney disease     Patient Active Problem List   Diagnosis Date Noted  . Coronary artery abnormality 07/18/2014  . Type 2 diabetes mellitus ([place]) 06/29/2012  . Chronic kidney disease 04/21/2010  . Asthma, moderate persistent 05/02/2003  . Benign hypertension 02/07/2003  Last creatinine per epic:07/02/15 1.77  Past Surgical History:  Procedure Laterality Date  . BREAST SURGERY Right    . CARDIAC SURGERY    . STOMACH SURGERY       No current facility-administered medications for this encounter.   Current Outpatient Prescriptions:  .  aspirin 81 MG tablet, Take 81 mg by mouth daily., Disp: , Rfl:  .  enalapril (VASOTEC) 20 MG tablet, Take 20 mg by mouth 2 (two) times daily., Disp: , Rfl:  .  ergocalciferol (VITAMIN D2) 50000 UNITS capsule, Take 50,000 Units by mouth once a week., Disp: , Rfl:  .  felodipine (PLENDIL) 10 MG 24 hr tablet, Take 10 mg by mouth daily., Disp: , Rfl:  .  Fluticasone-Salmeterol (ADVAIR) 250-50 MCG/DOSE AEPB, Inhale 1 puff into the lungs 2 (two) times daily., Disp: , Rfl:  .  hydrALAZINE (APRESOLINE) 10 MG tablet, Take 10 mg by mouth 3 (three) times daily., Disp: , Rfl:  .  insulin glargine (LANTUS) 100 UNIT/ML injection, Inject 15 Units into the skin at bedtime., Disp: , Rfl:  .  metoprolol tartrate (LOPRESSOR) 25 MG tablet, Take 25 mg by mouth 2 (two) times daily., Disp: , Rfl:  .  NOVOLOG FLEXPEN 100 UNIT/ML FlexPen, , Disp: , Rfl:  .  simvastatin (ZOCOR) 20 MG tablet, Take 20 mg by mouth daily., Disp: , Rfl:  .  SYMBICORT 160-4.5 MCG/ACT inhaler, , Disp: , Rfl:  .  albuterol (PROVENTIL HFA;VENTOLIN HFA) 108 (90 BASE) MCG/ACT inhaler, Inhale 2 puffs into the lungs every 6 (six) hours as needed for wheezing or shortness of breath., Disp: , Rfl:  .  naproxen (NAPROSYN) 500 MG tablet, Take 1 tablet (500  mg total) by mouth 2 (two) times daily with a meal., Disp: 20 tablet, Rfl: 0 .  sevelamer (RENAGEL) 800 MG tablet, Take 800 mg by mouth 3 (three) times daily with meals., Disp: , Rfl:  .  spironolactone (ALDACTONE) 25 MG tablet, Take 25 mg by mouth daily., Disp: , Rfl:   Allergies Patient has no known allergies.  History reviewed. No pertinent family history.  Social History Social History  Substance Use Topics  . Smoking status: Never Smoker  . Smokeless tobacco: Never Used  . Alcohol use No    Review of Systems Constitutional: No  fever/chills Eyes: No visual changes. ENT: No sore throat. Cardiovascular: Denies chest pain. Respiratory: Denies shortness of breath. Gastrointestinal: No abdominal pain.  No nausea, no vomiting.  No diarrhea.  No constipation. Genitourinary: Negative for dysuria. Musculoskeletal: Negative for back pain. As above. Skin: Negative for rash. Neurological: Negative for headaches, focal weakness or numbness.  10-point ROS otherwise negative.  ____________________________________________   PHYSICAL EXAM:  VITAL SIGNS: ED Triage Vitals  Enc Vitals Group     BP 01/30/16 1351 (!) 192/77     Pulse Rate 01/30/16 1351 (!) 108 Recheck 84     Resp 01/30/16 1351 16     Temp 01/30/16 1351 98.7 F (37.1 C)     Temp Source 01/30/16 1351 Oral     SpO2 01/30/16 1351 99 %     Weight 01/30/16 1353 187 lb (84.8 kg)     Height 01/30/16 1353 5\' 6"  (1.676 m)     Head Circumference --      Peak Flow --      Pain Score 01/30/16 1359 5     Pain Loc --      Pain Edu? --      Excl. in [place]? --   1505: BP recheck 174/76  Constitutional: Alert and oriented. Well appearing and in no acute distress. Eyes: Conjunctivae are normal. PERRL. EOMI. ENT      Head: Normocephalic and atraumatic.      Mouth/Throat: Mucous membranes are moist. Cardiovascular: Normal rate, regular rhythm. Grossly normal heart sounds.  Good peripheral circulation. Respiratory: Normal respiratory effort without tachypnea nor retractions. Breath sounds are clear and equal bilaterally. No wheezes, rales, rhonchi. Gastrointestinal: Soft and nontender.  Musculoskeletal:  Nontender with normal range of motion in all extremities. No midline cervical, thoracic or lumbar tenderness to palpation.  Except : Diffuse left distal radial and diffuse first metacarpal tenderness, with mild swelling, tenderness primarily at distal radius, no ecchymosis, minimally decreased left wrist rotation, full range of motion to left hand, slightly decreased left  hand grip, normal distal sensation and capillary refill to left hand, bilateral distal radial pulses equal and easily palpated. Left upper extremity otherwise nontender to palpation, with normal sensation.       Right lower leg:  No tenderness      Left lower leg:  No tenderness.  Neurologic:  Normal speech and language.  Speech is normal. No gait instability.  Skin:  Skin is warm, dry and intact. No rash noted. Psychiatric: Mood and affect are normal. Speech and behavior are normal. Patient exhibits appropriate insight and judgment   ___________________________________________   LABS (all labs ordered are listed, but only abnormal results are displayed)  Labs Reviewed - No data to display ____________________________________________  RADIOLOGY  Dg Wrist Complete Left  Result Date: 01/30/2016 CLINICAL DATA:  Left wrist pain after fall. EXAM: LEFT WRIST - COMPLETE 3+ VIEW COMPARISON:  None. FINDINGS:  There is no evidence of fracture or dislocation. Narrowing and sclerosis is seen involving the first carpal/metacarpal joint. Soft tissues are unremarkable. IMPRESSION: Osteoarthritis of the first carpometacarpal joint. No acute abnormality seen in the left wrist. Electronically Signed   By: Marijo Conception, M.D.   On: 01/30/2016 15:00   Dg Hand Complete Left  Result Date: 01/30/2016 CLINICAL DATA:  Left hand pain after fall 5 days ago. EXAM: LEFT HAND - COMPLETE 3+ VIEW COMPARISON:  None. FINDINGS: There is no evidence of fracture or dislocation. There is no evidence of arthropathy or other focal bone abnormality. Soft tissues are unremarkable. IMPRESSION: No definite abnormality seen in the left hand. Electronically Signed   By: Marijo Conception, M.D.   On: 01/30/2016 14:58   ____________________________________________   PROCEDURES Procedures   Velcro cock-up splint applied to left wrist by RN. ____________________________________________   INITIAL IMPRESSION / [place] /  ED COURSE  Pertinent labs & imaging results that were available during my care of the patient were reviewed by me and considered in my medical decision making (see chart for details).  Well-appearing patient. No acute distress. Son at bedside. Presents with a complaint of left wrist pain post fall and catch herself with both hands. Patient states that she fell because she got up too quickly and did not have her feet ready. Denies any other trigger for her fall. Reports otherwise feels well. Will evaluate left hand and left wrist x-ray.  Per radiologist left hand no definite abnormality seen, and left wrist as he arthritis of the first carpometacarpal joint, no acute abnormality seen. Discussed these findings with patient and son. Supportive left wrist Velcro cock-up splint. Discussed medication, and patient reports will take over-the-counter Tylenol as needed for pain. Encouraged ice and elevation. Discussed follow-up with primary care physician or orthopedic as needed for continued pain. Information given.  Discussed follow up with Primary care physician this week. Discussed follow up and return parameters including no resolution or any worsening concerns. Patient verbalized understanding and agreed to plan.   ____________________________________________   FINAL CLINICAL IMPRESSION(S) / ED DIAGNOSES  Final diagnoses:  Left hand pain  Left wrist pain     New Prescriptions   No medications on file    Note: This dictation was prepared with Dragon dictation along with smaller phrase technology. Any transcriptional errors that result from this process are unintentional.         Marylene Land, NP 01/30/16 1725

## 2016-03-03 ENCOUNTER — Ambulatory Visit: Payer: Medicare Other | Admitting: Podiatry

## 2016-04-02 DIAGNOSIS — N183 Chronic kidney disease, stage 3 unspecified: Secondary | ICD-10-CM | POA: Insufficient documentation

## 2016-04-03 ENCOUNTER — Ambulatory Visit (INDEPENDENT_AMBULATORY_CARE_PROVIDER_SITE_OTHER): Payer: Medicare Other | Admitting: Podiatry

## 2016-04-03 ENCOUNTER — Encounter: Payer: Self-pay | Admitting: Podiatry

## 2016-04-03 DIAGNOSIS — M79676 Pain in unspecified toe(s): Secondary | ICD-10-CM | POA: Diagnosis not present

## 2016-04-03 DIAGNOSIS — B351 Tinea unguium: Secondary | ICD-10-CM

## 2016-04-03 DIAGNOSIS — E119 Type 2 diabetes mellitus without complications: Secondary | ICD-10-CM

## 2016-04-03 NOTE — Progress Notes (Signed)
Complaint:  Visit Type: Patient returns to my office for continued preventative foot care services. Complaint: Patient states" my nails have grown long and thick and become painful to walk and wear shoes" Patient has been diagnosed with DM with no foot complications. The patient presents for preventative foot care services. No changes to ROS  Podiatric Exam: Vascular: dorsalis pedis and posterior tibial pulses are palpable bilateral. Capillary return is immediate. Temperature gradient is WNL. Skin turgor WNL  Sensorium: Normal Semmes Weinstein monofilament test. Normal tactile sensation bilaterally. Nail Exam: Pt has thick disfigured discolored nails with subungual debris noted bilateral entire nail hallux through fifth toenails Ulcer Exam: There is no evidence of ulcer or pre-ulcerative changes or infection. Orthopedic Exam: Muscle tone and strength are WNL. No limitations in general ROM. No crepitus or effusions noted. Foot type and digits show no abnormalities. Bony prominences are unremarkable. Skin: No Porokeratosis. No infection or ulcers  Diagnosis:  Onychomycosis, , Pain in right toe, pain in left toes  Treatment & Plan Procedures and Treatment: Consent by patient was obtained for treatment procedures. The patient understood the discussion of treatment and procedures well. All questions were answered thoroughly reviewed. Debridement of mycotic and hypertrophic toenails, 1 through 5 bilateral and clearing of subungual debris. No ulceration, no infection noted.  Return Visit-Office Procedure: Patient instructed to return to the office for a follow up visit 3 months for continued evaluation and treatment.    Ildefonso Keaney DPM 

## 2016-07-07 ENCOUNTER — Ambulatory Visit (INDEPENDENT_AMBULATORY_CARE_PROVIDER_SITE_OTHER): Payer: Medicare Other | Admitting: Podiatry

## 2016-07-07 DIAGNOSIS — E119 Type 2 diabetes mellitus without complications: Secondary | ICD-10-CM

## 2016-07-07 DIAGNOSIS — M79676 Pain in unspecified toe(s): Secondary | ICD-10-CM | POA: Diagnosis not present

## 2016-07-07 NOTE — Progress Notes (Signed)
Complaint:  Visit Type: Patient returns to my office for continued preventative foot care services. Complaint: Patient states" my nails have grown long and thick and become painful to walk and wear shoes" Patient has been diagnosed with DM with no foot complications. The patient presents for preventative foot care services. No changes to ROS  Podiatric Exam: Vascular: dorsalis pedis and posterior tibial pulses are palpable bilateral. Capillary return is immediate. Temperature gradient is WNL. Skin turgor WNL  Sensorium: Normal Semmes Weinstein monofilament test. Normal tactile sensation bilaterally. Nail Exam: Pt has thick disfigured discolored nails with subungual debris noted bilateral entire nail hallux through fifth toenails Ulcer Exam: There is no evidence of ulcer or pre-ulcerative changes or infection. Orthopedic Exam: Muscle tone and strength are WNL. No limitations in general ROM. No crepitus or effusions noted. Foot type and digits show no abnormalities. Bony prominences are unremarkable. Skin: No Porokeratosis. No infection or ulcers  Diagnosis:  Onychomycosis, , Pain in right toe, pain in left toes  Treatment & Plan Procedures and Treatment: Consent by patient was obtained for treatment procedures. The patient understood the discussion of treatment and procedures well. All questions were answered thoroughly reviewed. Debridement of mycotic and hypertrophic toenails, 1 through 5 bilateral and clearing of subungual debris. No ulceration, no infection noted.  Return Visit-Office Procedure: Patient instructed to return to the office for a follow up visit 3 months for continued evaluation and treatment.    Ellicia Alix DPM 

## 2016-10-06 ENCOUNTER — Ambulatory Visit: Payer: Medicare Other | Admitting: Podiatry

## 2016-10-13 ENCOUNTER — Ambulatory Visit (INDEPENDENT_AMBULATORY_CARE_PROVIDER_SITE_OTHER): Payer: Medicare Other | Admitting: Podiatry

## 2016-10-13 DIAGNOSIS — B351 Tinea unguium: Secondary | ICD-10-CM | POA: Diagnosis not present

## 2016-10-13 DIAGNOSIS — M79675 Pain in left toe(s): Secondary | ICD-10-CM | POA: Diagnosis not present

## 2016-10-13 DIAGNOSIS — M79674 Pain in right toe(s): Secondary | ICD-10-CM | POA: Diagnosis not present

## 2016-10-13 DIAGNOSIS — E119 Type 2 diabetes mellitus without complications: Secondary | ICD-10-CM

## 2016-10-13 DIAGNOSIS — M79676 Pain in unspecified toe(s): Secondary | ICD-10-CM

## 2016-10-13 NOTE — Progress Notes (Signed)
Complaint:  Visit Type: Patient returns to my office for continued preventative foot care services. Complaint: Patient states" my nails have grown long and thick and become painful to walk and wear shoes" Patient has been diagnosed with DM with no foot complications. The patient presents for preventative foot care services. No changes to ROS  Podiatric Exam: Vascular: dorsalis pedis and posterior tibial pulses are palpable bilateral. Capillary return is immediate. Temperature gradient is WNL. Skin turgor WNL  Sensorium: Normal Semmes Weinstein monofilament test. Normal tactile sensation bilaterally. Nail Exam: Pt has thick disfigured discolored nails with subungual debris noted bilateral entire nail hallux through fifth toenails Ulcer Exam: There is no evidence of ulcer or pre-ulcerative changes or infection. Orthopedic Exam: Muscle tone and strength are WNL. No limitations in general ROM. No crepitus or effusions noted. Foot type and digits show no abnormalities. Bony prominences are unremarkable. Skin: No Porokeratosis. No infection or ulcers  Diagnosis:  Onychomycosis, , Pain in right toe, pain in left toes  Treatment & Plan Procedures and Treatment: Consent by patient was obtained for treatment procedures. The patient understood the discussion of treatment and procedures well. All questions were answered thoroughly reviewed. Debridement of mycotic and hypertrophic toenails, 1 through 5 bilateral and clearing of subungual debris. No ulceration, no infection noted.  Return Visit-Office Procedure: Patient instructed to return to the office for a follow up visit 3 months for continued evaluation and treatment.    Nefertari Rebman DPM 

## 2017-01-15 ENCOUNTER — Ambulatory Visit: Payer: Medicare Other | Admitting: Podiatry

## 2017-04-13 ENCOUNTER — Ambulatory Visit: Payer: Medicare Other | Admitting: Podiatry

## 2017-04-20 ENCOUNTER — Ambulatory Visit: Payer: Medicare Other | Admitting: Podiatry

## 2017-04-23 ENCOUNTER — Ambulatory Visit: Payer: Medicare Other | Admitting: Podiatry

## 2017-06-11 ENCOUNTER — Encounter: Payer: Self-pay | Admitting: Podiatry

## 2017-06-11 ENCOUNTER — Ambulatory Visit (INDEPENDENT_AMBULATORY_CARE_PROVIDER_SITE_OTHER): Payer: Medicare HMO | Admitting: Podiatry

## 2017-06-11 DIAGNOSIS — M79675 Pain in left toe(s): Secondary | ICD-10-CM

## 2017-06-11 DIAGNOSIS — B351 Tinea unguium: Secondary | ICD-10-CM

## 2017-06-11 DIAGNOSIS — M79674 Pain in right toe(s): Secondary | ICD-10-CM | POA: Diagnosis not present

## 2017-06-11 DIAGNOSIS — E119 Type 2 diabetes mellitus without complications: Secondary | ICD-10-CM

## 2017-06-11 NOTE — Progress Notes (Addendum)
Complaint:  Visit Type: Patient returns to my office for continued preventative foot care services. Complaint: Patient states" my nails have grown long and thick and become painful to walk and wear shoes" Patient has been diagnosed with DM with no foot complications. The patient presents for preventative foot care services. No changes to ROS.  Patient has not been seen for over 8 months.  Podiatric Exam: Vascular: dorsalis pedis and posterior tibial pulses are palpable bilateral. Capillary return is immediate. Temperature gradient is WNL. Skin turgor WNL  Sensorium: Normal Semmes Weinstein monofilament test. Normal tactile sensation bilaterally. Nail Exam: Pt has thick disfigured discolored nails with subungual debris noted bilateral entire nail hallux through fifth toenails Ulcer Exam: There is no evidence of ulcer or pre-ulcerative changes or infection. Orthopedic Exam: Muscle tone and strength are WNL. No limitations in general ROM. No crepitus or effusions noted. Foot type and digits show no abnormalities. Bony prominences are unremarkable. Skin: No Porokeratosis. No infection or ulcers  Diagnosis:  Onychomycosis, , Pain in right toe, pain in left toes  Treatment & Plan Procedures and Treatment: Consent by patient was obtained for treatment procedures. The patient understood the discussion of treatment and procedures well. All questions were answered thoroughly reviewed. Debridement of mycotic and hypertrophic toenails, 1 through 5 bilateral and clearing of subungual debris. No ulceration, no infection noted.  ABN signed for 2019. Return Visit-Office Procedure: Patient instructed to return to the office for a follow up visit   10 weeks for continued evaluation and treatment.    Gardiner Barefoot DPM

## 2017-08-17 ENCOUNTER — Ambulatory Visit: Payer: Medicare Other | Admitting: Podiatry

## 2018-03-11 ENCOUNTER — Ambulatory Visit: Payer: Medicare HMO | Admitting: Podiatry

## 2018-03-25 ENCOUNTER — Ambulatory Visit: Payer: Medicare HMO | Admitting: Podiatry

## 2018-06-10 ENCOUNTER — Other Ambulatory Visit: Payer: Self-pay

## 2018-06-10 ENCOUNTER — Ambulatory Visit (INDEPENDENT_AMBULATORY_CARE_PROVIDER_SITE_OTHER): Payer: Medicare HMO | Admitting: Podiatry

## 2018-06-10 ENCOUNTER — Ambulatory Visit: Payer: Medicare HMO | Admitting: Podiatry

## 2018-06-10 ENCOUNTER — Encounter: Payer: Self-pay | Admitting: Podiatry

## 2018-06-10 VITALS — Temp 98.0°F

## 2018-06-10 DIAGNOSIS — E119 Type 2 diabetes mellitus without complications: Secondary | ICD-10-CM

## 2018-06-10 DIAGNOSIS — M79674 Pain in right toe(s): Secondary | ICD-10-CM | POA: Diagnosis not present

## 2018-06-10 DIAGNOSIS — M79675 Pain in left toe(s): Secondary | ICD-10-CM | POA: Diagnosis not present

## 2018-06-10 DIAGNOSIS — B351 Tinea unguium: Secondary | ICD-10-CM

## 2018-06-10 NOTE — Progress Notes (Signed)
Complaint:  Visit Type: Patient returns to my office for continued preventative foot care services. Complaint: Patient states" my nails have grown long and thick and become painful to walk and wear shoes" Patient has been diagnosed with DM with no foot complications. The patient presents for preventative foot care services. No changes to ROS.  Patient has not been seen for over 12 months.  Podiatric Exam: Vascular: dorsalis pedis and posterior tibial pulses are palpable bilateral. Capillary return is immediate. Temperature gradient is WNL. Skin turgor WNL  Sensorium: Normal Semmes Weinstein monofilament test. Normal tactile sensation bilaterally. Nail Exam: Pt has thick disfigured discolored nails with subungual debris noted bilateral entire nail hallux through fifth toenails Ulcer Exam: There is no evidence of ulcer or pre-ulcerative changes or infection. Orthopedic Exam: Muscle tone and strength are WNL. No limitations in general ROM. No crepitus or effusions noted. Foot type and digits show no abnormalities. Bony prominences are unremarkable. Skin: No Porokeratosis. No infection or ulcers  Diagnosis:  Onychomycosis, , Pain in right toe, pain in left toes  Treatment & Plan Procedures and Treatment: Consent by patient was obtained for treatment procedures. The patient understood the discussion of treatment and procedures well. All questions were answered thoroughly reviewed. Debridement of mycotic and hypertrophic toenails, 1 through 5 bilateral and clearing of subungual debris. No ulceration, no infection noted.    Return Visit-Office Procedure: Patient instructed to return to the office for a follow up visit   12 weeks for continued evaluation and treatment.    Gardiner Barefoot DPM

## 2018-09-09 ENCOUNTER — Other Ambulatory Visit: Payer: Self-pay

## 2018-09-09 ENCOUNTER — Ambulatory Visit (INDEPENDENT_AMBULATORY_CARE_PROVIDER_SITE_OTHER): Payer: Medicare HMO | Admitting: Podiatry

## 2018-09-09 ENCOUNTER — Encounter: Payer: Self-pay | Admitting: Podiatry

## 2018-09-09 DIAGNOSIS — B351 Tinea unguium: Secondary | ICD-10-CM | POA: Diagnosis not present

## 2018-09-09 DIAGNOSIS — E119 Type 2 diabetes mellitus without complications: Secondary | ICD-10-CM

## 2018-09-09 DIAGNOSIS — M79675 Pain in left toe(s): Secondary | ICD-10-CM | POA: Diagnosis not present

## 2018-09-09 DIAGNOSIS — M79674 Pain in right toe(s): Secondary | ICD-10-CM | POA: Diagnosis not present

## 2018-09-09 NOTE — Progress Notes (Signed)
Complaint:  Visit Type: Patient returns to my office for continued preventative foot care services. Complaint: Patient states" my nails have grown long and thick and become painful to walk and wear shoes" Patient has been diagnosed with DM with no foot complications. The patient presents for preventative foot care services. No changes to ROS.  Patient has not been seen for over 12 months.  Podiatric Exam: Vascular: dorsalis pedis and posterior tibial pulses are palpable bilateral. Capillary return is immediate. Temperature gradient is WNL. Skin turgor WNL  Sensorium: Normal Semmes Weinstein monofilament test. Normal tactile sensation bilaterally. Nail Exam: Pt has thick disfigured discolored nails with subungual debris noted bilateral entire nail hallux through fifth toenails Ulcer Exam: There is no evidence of ulcer or pre-ulcerative changes or infection. Orthopedic Exam: Muscle tone and strength are WNL. No limitations in general ROM. No crepitus or effusions noted. Foot type and digits show no abnormalities. Bony prominences are unremarkable. Skin: No Porokeratosis. No infection or ulcers  Diagnosis:  Onychomycosis, , Pain in right toe, pain in left toes  Treatment & Plan Procedures and Treatment: Consent by patient was obtained for treatment procedures. The patient understood the discussion of treatment and procedures well. All questions were answered thoroughly reviewed. Debridement of mycotic and hypertrophic toenails, 1 through 5 bilateral and clearing of subungual debris. No ulceration, no infection noted.    Return Visit-Office Procedure: Patient instructed to return to the office for a follow up visit   10 weeks for continued evaluation and treatment.    Gardiner Barefoot DPM

## 2018-11-22 ENCOUNTER — Encounter: Payer: Self-pay | Admitting: Podiatry

## 2018-11-22 ENCOUNTER — Ambulatory Visit (INDEPENDENT_AMBULATORY_CARE_PROVIDER_SITE_OTHER): Payer: Medicare HMO | Admitting: Podiatry

## 2018-11-22 ENCOUNTER — Other Ambulatory Visit: Payer: Self-pay

## 2018-11-22 DIAGNOSIS — M79674 Pain in right toe(s): Secondary | ICD-10-CM | POA: Diagnosis not present

## 2018-11-22 DIAGNOSIS — B351 Tinea unguium: Secondary | ICD-10-CM | POA: Diagnosis not present

## 2018-11-22 DIAGNOSIS — E119 Type 2 diabetes mellitus without complications: Secondary | ICD-10-CM | POA: Diagnosis not present

## 2018-11-22 DIAGNOSIS — M79675 Pain in left toe(s): Secondary | ICD-10-CM | POA: Diagnosis not present

## 2018-11-22 NOTE — Progress Notes (Signed)
Complaint:  Visit Type: Patient returns to my office for continued preventative foot care services. Complaint: Patient states" my nails have grown long and thick and become painful to walk and wear shoes" Patient has been diagnosed with DM with no foot complications. The patient presents for preventative foot care services. No changes to ROS  Podiatric Exam: Vascular: dorsalis pedis and posterior tibial pulses are palpable bilateral. Capillary return is immediate. Temperature gradient is WNL. Skin turgor WNL  Sensorium: Normal Semmes Weinstein monofilament test. Normal tactile sensation bilaterally. Nail Exam: Pt has thick disfigured discolored nails with subungual debris noted bilateral entire nail hallux through fifth toenails Ulcer Exam: There is no evidence of ulcer or pre-ulcerative changes or infection. Orthopedic Exam: Muscle tone and strength are WNL. No limitations in general ROM. No crepitus or effusions noted. Foot type and digits show no abnormalities. Bony prominences are unremarkable. Skin: No Porokeratosis. No infection or ulcers  Diagnosis:  Onychomycosis, , Pain in right toe, pain in left toes  Treatment & Plan Procedures and Treatment: Consent by patient was obtained for treatment procedures. The patient understood the discussion of treatment and procedures well. All questions were answered thoroughly reviewed. Debridement of mycotic and hypertrophic toenails, 1 through 5 bilateral and clearing of subungual debris. No ulceration, no infection noted.  Return Visit-Office Procedure: Patient instructed to return to the office for a follow up visit 10 weeks  for continued evaluation and treatment.    Jestin Burbach DPM 

## 2019-01-31 ENCOUNTER — Other Ambulatory Visit: Payer: Self-pay

## 2019-01-31 ENCOUNTER — Encounter: Payer: Self-pay | Admitting: Podiatry

## 2019-01-31 ENCOUNTER — Ambulatory Visit (INDEPENDENT_AMBULATORY_CARE_PROVIDER_SITE_OTHER): Payer: Medicare HMO | Admitting: Podiatry

## 2019-01-31 DIAGNOSIS — M79674 Pain in right toe(s): Secondary | ICD-10-CM | POA: Diagnosis not present

## 2019-01-31 DIAGNOSIS — B351 Tinea unguium: Secondary | ICD-10-CM | POA: Diagnosis not present

## 2019-01-31 DIAGNOSIS — M79675 Pain in left toe(s): Secondary | ICD-10-CM | POA: Diagnosis not present

## 2019-01-31 DIAGNOSIS — E119 Type 2 diabetes mellitus without complications: Secondary | ICD-10-CM

## 2019-01-31 NOTE — Progress Notes (Signed)
Complaint:  Visit Type: Patient returns to my office for continued preventative foot care services. Complaint: Patient states" my nails have grown long and thick and become painful to walk and wear shoes" Patient has been diagnosed with DM with no foot complications. The patient presents for preventative foot care services. No changes to ROS  Podiatric Exam: Vascular: dorsalis pedis and posterior tibial pulses are palpable bilateral. Capillary return is immediate. Temperature gradient is WNL. Skin turgor WNL  Sensorium: Normal Semmes Weinstein monofilament test. Normal tactile sensation bilaterally. Nail Exam: Pt has thick disfigured discolored nails with subungual debris noted bilateral entire nail hallux through fifth toenails Ulcer Exam: There is no evidence of ulcer or pre-ulcerative changes or infection. Orthopedic Exam: Muscle tone and strength are WNL. No limitations in general ROM. No crepitus or effusions noted. Foot type and digits show no abnormalities. Bony prominences are unremarkable. Skin: No Porokeratosis. No infection or ulcers  Diagnosis:  Onychomycosis, , Pain in right toe, pain in left toes  Treatment & Plan Procedures and Treatment: Consent by patient was obtained for treatment procedures. The patient understood the discussion of treatment and procedures well. All questions were answered thoroughly reviewed. Debridement of mycotic and hypertrophic toenails, 1 through 5 bilateral and clearing of subungual debris. No ulceration, no infection noted.  Return Visit-Office Procedure: Patient instructed to return to the office for a follow up visit 10 weeks  for continued evaluation and treatment.    Linley Moxley DPM 

## 2019-04-11 ENCOUNTER — Ambulatory Visit: Payer: Medicare HMO | Admitting: Podiatry

## 2019-04-15 ENCOUNTER — Other Ambulatory Visit: Payer: Self-pay

## 2019-04-15 ENCOUNTER — Emergency Department: Payer: Medicare Other

## 2019-04-15 ENCOUNTER — Inpatient Hospital Stay
Admission: EM | Admit: 2019-04-15 | Discharge: 2019-05-07 | DRG: 871 | Disposition: E | Payer: Medicare Other | Attending: Internal Medicine | Admitting: Internal Medicine

## 2019-04-15 DIAGNOSIS — I4901 Ventricular fibrillation: Secondary | ICD-10-CM | POA: Diagnosis not present

## 2019-04-15 DIAGNOSIS — I2510 Atherosclerotic heart disease of native coronary artery without angina pectoris: Secondary | ICD-10-CM | POA: Diagnosis not present

## 2019-04-15 DIAGNOSIS — I130 Hypertensive heart and chronic kidney disease with heart failure and stage 1 through stage 4 chronic kidney disease, or unspecified chronic kidney disease: Secondary | ICD-10-CM | POA: Diagnosis not present

## 2019-04-15 DIAGNOSIS — Z743 Need for continuous supervision: Secondary | ICD-10-CM | POA: Diagnosis not present

## 2019-04-15 DIAGNOSIS — Z79899 Other long term (current) drug therapy: Secondary | ICD-10-CM | POA: Diagnosis not present

## 2019-04-15 DIAGNOSIS — G9340 Encephalopathy, unspecified: Secondary | ICD-10-CM | POA: Diagnosis not present

## 2019-04-15 DIAGNOSIS — A419 Sepsis, unspecified organism: Principal | ICD-10-CM

## 2019-04-15 DIAGNOSIS — N179 Acute kidney failure, unspecified: Secondary | ICD-10-CM | POA: Diagnosis present

## 2019-04-15 DIAGNOSIS — Z66 Do not resuscitate: Secondary | ICD-10-CM | POA: Diagnosis present

## 2019-04-15 DIAGNOSIS — E872 Acidosis: Secondary | ICD-10-CM | POA: Diagnosis not present

## 2019-04-15 DIAGNOSIS — I447 Left bundle-branch block, unspecified: Secondary | ICD-10-CM | POA: Diagnosis present

## 2019-04-15 DIAGNOSIS — I4891 Unspecified atrial fibrillation: Secondary | ICD-10-CM | POA: Diagnosis present

## 2019-04-15 DIAGNOSIS — Z794 Long term (current) use of insulin: Secondary | ICD-10-CM

## 2019-04-15 DIAGNOSIS — R627 Adult failure to thrive: Secondary | ICD-10-CM | POA: Diagnosis present

## 2019-04-15 DIAGNOSIS — E1165 Type 2 diabetes mellitus with hyperglycemia: Secondary | ICD-10-CM | POA: Diagnosis not present

## 2019-04-15 DIAGNOSIS — Z6827 Body mass index (BMI) 27.0-27.9, adult: Secondary | ICD-10-CM

## 2019-04-15 DIAGNOSIS — Z951 Presence of aortocoronary bypass graft: Secondary | ICD-10-CM | POA: Diagnosis not present

## 2019-04-15 DIAGNOSIS — F0390 Unspecified dementia without behavioral disturbance: Secondary | ICD-10-CM | POA: Diagnosis present

## 2019-04-15 DIAGNOSIS — R0602 Shortness of breath: Secondary | ICD-10-CM | POA: Diagnosis not present

## 2019-04-15 DIAGNOSIS — Z7951 Long term (current) use of inhaled steroids: Secondary | ICD-10-CM | POA: Diagnosis not present

## 2019-04-15 DIAGNOSIS — E785 Hyperlipidemia, unspecified: Secondary | ICD-10-CM | POA: Diagnosis present

## 2019-04-15 DIAGNOSIS — J189 Pneumonia, unspecified organism: Secondary | ICD-10-CM | POA: Diagnosis present

## 2019-04-15 DIAGNOSIS — I472 Ventricular tachycardia: Secondary | ICD-10-CM | POA: Diagnosis not present

## 2019-04-15 DIAGNOSIS — I10 Essential (primary) hypertension: Secondary | ICD-10-CM

## 2019-04-15 DIAGNOSIS — Z20822 Contact with and (suspected) exposure to covid-19: Secondary | ICD-10-CM | POA: Diagnosis not present

## 2019-04-15 DIAGNOSIS — R32 Unspecified urinary incontinence: Secondary | ICD-10-CM | POA: Diagnosis present

## 2019-04-15 DIAGNOSIS — D631 Anemia in chronic kidney disease: Secondary | ICD-10-CM | POA: Diagnosis not present

## 2019-04-15 DIAGNOSIS — R4182 Altered mental status, unspecified: Secondary | ICD-10-CM | POA: Diagnosis not present

## 2019-04-15 DIAGNOSIS — I361 Nonrheumatic tricuspid (valve) insufficiency: Secondary | ICD-10-CM | POA: Diagnosis not present

## 2019-04-15 DIAGNOSIS — Z136 Encounter for screening for cardiovascular disorders: Secondary | ICD-10-CM | POA: Diagnosis not present

## 2019-04-15 DIAGNOSIS — I5023 Acute on chronic systolic (congestive) heart failure: Secondary | ICD-10-CM | POA: Diagnosis present

## 2019-04-15 DIAGNOSIS — E1122 Type 2 diabetes mellitus with diabetic chronic kidney disease: Secondary | ICD-10-CM | POA: Diagnosis present

## 2019-04-15 DIAGNOSIS — J984 Other disorders of lung: Secondary | ICD-10-CM | POA: Diagnosis not present

## 2019-04-15 DIAGNOSIS — R531 Weakness: Secondary | ICD-10-CM | POA: Diagnosis not present

## 2019-04-15 DIAGNOSIS — N184 Chronic kidney disease, stage 4 (severe): Secondary | ICD-10-CM | POA: Diagnosis present

## 2019-04-15 DIAGNOSIS — I959 Hypotension, unspecified: Secondary | ICD-10-CM | POA: Diagnosis not present

## 2019-04-15 DIAGNOSIS — I5020 Unspecified systolic (congestive) heart failure: Secondary | ICD-10-CM | POA: Diagnosis present

## 2019-04-15 LAB — URINALYSIS, COMPLETE (UACMP) WITH MICROSCOPIC
Bilirubin Urine: NEGATIVE
Glucose, UA: NEGATIVE mg/dL
Hgb urine dipstick: NEGATIVE
Ketones, ur: NEGATIVE mg/dL
Leukocytes,Ua: NEGATIVE
Nitrite: NEGATIVE
Protein, ur: NEGATIVE mg/dL
Specific Gravity, Urine: 1.012 (ref 1.005–1.030)
Squamous Epithelial / LPF: NONE SEEN (ref 0–5)
pH: 5 (ref 5.0–8.0)

## 2019-04-15 LAB — RESPIRATORY PANEL BY RT PCR (FLU A&B, COVID)
Influenza A by PCR: NEGATIVE
Influenza B by PCR: NEGATIVE
SARS Coronavirus 2 by RT PCR: NEGATIVE

## 2019-04-15 LAB — BASIC METABOLIC PANEL
Anion gap: 10 (ref 5–15)
BUN: 64 mg/dL — ABNORMAL HIGH (ref 8–23)
CO2: 16 mmol/L — ABNORMAL LOW (ref 22–32)
Calcium: 8.3 mg/dL — ABNORMAL LOW (ref 8.9–10.3)
Chloride: 113 mmol/L — ABNORMAL HIGH (ref 98–111)
Creatinine, Ser: 2.63 mg/dL — ABNORMAL HIGH (ref 0.44–1.00)
GFR calc Af Amer: 19 mL/min — ABNORMAL LOW (ref >60)
GFR calc non Af Amer: 16 mL/min — ABNORMAL LOW (ref >60)
Glucose, Bld: 321 mg/dL — ABNORMAL HIGH (ref 70–99)
Potassium: 4.3 mmol/L (ref 3.5–5.1)
Sodium: 139 mmol/L (ref 135–145)

## 2019-04-15 LAB — CBC
HCT: 29.4 % — ABNORMAL LOW (ref 36.0–46.0)
Hemoglobin: 8.7 g/dL — ABNORMAL LOW (ref 12.0–15.0)
MCH: 29.1 pg (ref 26.0–34.0)
MCHC: 29.6 g/dL — ABNORMAL LOW (ref 30.0–36.0)
MCV: 98.3 fL (ref 80.0–100.0)
Platelets: 138 10*3/uL — ABNORMAL LOW (ref 150–400)
RBC: 2.99 MIL/uL — ABNORMAL LOW (ref 3.87–5.11)
RDW: 13.7 % (ref 11.5–15.5)
WBC: 8.3 10*3/uL (ref 4.0–10.5)
nRBC: 0 % (ref 0.0–0.2)

## 2019-04-15 MED ORDER — SODIUM CHLORIDE 0.9 % IV SOLN: INTRAVENOUS | Status: DC

## 2019-04-15 MED ORDER — ACETAMINOPHEN 325 MG PO TABS
650.0000 mg | ORAL_TABLET | Freq: Four times a day (QID) | ORAL | Status: DC | PRN
  Administered 2019-04-16: 650 mg via ORAL
  Filled 2019-04-15: qty 2

## 2019-04-15 MED ORDER — IPRATROPIUM-ALBUTEROL 0.5-2.5 (3) MG/3ML IN SOLN
3.0000 mL | Freq: Four times a day (QID) | RESPIRATORY_TRACT | Status: DC
  Administered 2019-04-16 (×2): 3 mL via RESPIRATORY_TRACT
  Filled 2019-04-15 (×3): qty 3

## 2019-04-15 MED ORDER — HEPARIN SODIUM (PORCINE) 5000 UNIT/ML IJ SOLN
5000.0000 [IU] | Freq: Three times a day (TID) | INTRAMUSCULAR | Status: DC
  Administered 2019-04-16 – 2019-04-17 (×5): 5000 [IU] via SUBCUTANEOUS
  Filled 2019-04-15 (×5): qty 1

## 2019-04-15 MED ORDER — SODIUM CHLORIDE 0.9 % IV SOLN
2.0000 g | Freq: Once | INTRAVENOUS | Status: AC
  Administered 2019-04-15: 2 g via INTRAVENOUS
  Filled 2019-04-15: qty 20

## 2019-04-15 MED ORDER — DOXAZOSIN MESYLATE 2 MG PO TABS: 2.0000 mg | ORAL_TABLET | Freq: Every day | ORAL | Status: DC

## 2019-04-15 MED ORDER — ERGOCALCIFEROL 1.25 MG (50000 UT) PO CAPS: 50000.0000 [IU] | ORAL_CAPSULE | ORAL | Status: DC

## 2019-04-15 MED ORDER — FELODIPINE ER 10 MG PO TB24: 10.0000 mg | ORAL_TABLET | Freq: Every day | ORAL | Status: DC

## 2019-04-15 MED ORDER — ONDANSETRON HCL 4 MG/2ML IJ SOLN: 4.0000 mg | Freq: Four times a day (QID) | INTRAMUSCULAR | Status: DC | PRN

## 2019-04-15 MED ORDER — HYDRALAZINE HCL 10 MG PO TABS: 10.0000 mg | ORAL_TABLET | Freq: Three times a day (TID) | ORAL | Status: DC

## 2019-04-15 MED ORDER — SODIUM CHLORIDE 0.9 % IV SOLN
500.0000 mg | Freq: Once | INTRAVENOUS | Status: AC
  Administered 2019-04-15: 500 mg via INTRAVENOUS
  Filled 2019-04-15: qty 500

## 2019-04-15 MED ORDER — SODIUM BICARBONATE 650 MG PO TABS: 650.0000 mg | ORAL_TABLET | Freq: Every day | ORAL | Status: DC

## 2019-04-15 MED ORDER — GUAIFENESIN ER 600 MG PO TB12
600.0000 mg | ORAL_TABLET | Freq: Two times a day (BID) | ORAL | Status: DC
  Administered 2019-04-17: 600 mg via ORAL
  Filled 2019-04-15 (×3): qty 1

## 2019-04-15 MED ORDER — POTASSIUM CHLORIDE 20 MEQ PO PACK: 40.0000 meq | PACK | Freq: Once | ORAL | Status: DC

## 2019-04-15 MED ORDER — SEVELAMER CARBONATE 800 MG PO TABS
800.0000 mg | ORAL_TABLET | Freq: Three times a day (TID) | ORAL | Status: DC
  Administered 2019-04-16 – 2019-04-17 (×2): 800 mg via ORAL
  Filled 2019-04-15 (×6): qty 1

## 2019-04-15 MED ORDER — HYDRALAZINE HCL 50 MG PO TABS: 50.0000 mg | ORAL_TABLET | Freq: Every day | ORAL | Status: DC

## 2019-04-15 MED ORDER — ASPIRIN EC 81 MG PO TBEC
81.0000 mg | DELAYED_RELEASE_TABLET | Freq: Every day | ORAL | Status: DC
  Administered 2019-04-17: 81 mg via ORAL
  Filled 2019-04-15: qty 1

## 2019-04-15 MED ORDER — SODIUM CHLORIDE 0.9 % IV BOLUS
1000.0000 mL | Freq: Once | INTRAVENOUS | Status: AC
  Administered 2019-04-15: 21:00:00 1000 mL via INTRAVENOUS

## 2019-04-15 MED ORDER — ACETAMINOPHEN 650 MG RE SUPP: 650.0000 mg | Freq: Four times a day (QID) | RECTAL | Status: DC | PRN

## 2019-04-15 MED ORDER — MAGNESIUM HYDROXIDE 400 MG/5ML PO SUSP: 30.0000 mL | Freq: Every day | ORAL | Status: DC | PRN

## 2019-04-15 MED ORDER — SIMVASTATIN 20 MG PO TABS
20.0000 mg | ORAL_TABLET | Freq: Every day | ORAL | Status: DC
  Filled 2019-04-15: qty 1

## 2019-04-15 MED ORDER — HYDROCOD POLST-CPM POLST ER 10-8 MG/5ML PO SUER
5.0000 mL | Freq: Two times a day (BID) | ORAL | Status: DC | PRN
  Administered 2019-04-16: 5 mL via ORAL
  Filled 2019-04-15: qty 5

## 2019-04-15 MED ORDER — INSULIN GLARGINE 100 UNIT/ML ~~LOC~~ SOLN
10.0000 [IU] | Freq: Every day | SUBCUTANEOUS | Status: DC
  Administered 2019-04-16 – 2019-04-17 (×2): 10 [IU] via SUBCUTANEOUS
  Filled 2019-04-15 (×3): qty 0.1

## 2019-04-15 MED ORDER — ENOXAPARIN SODIUM 40 MG/0.4ML ~~LOC~~ SOLN: 40.0000 mg | SUBCUTANEOUS | Status: DC

## 2019-04-15 MED ORDER — SODIUM CHLORIDE 0.9 % IV SOLN
500.0000 mg | INTRAVENOUS | Status: DC
  Administered 2019-04-16 – 2019-04-17 (×2): 500 mg via INTRAVENOUS
  Filled 2019-04-15 (×2): qty 500

## 2019-04-15 MED ORDER — METOPROLOL TARTRATE 25 MG PO TABS: 25.0000 mg | ORAL_TABLET | Freq: Two times a day (BID) | ORAL | Status: DC

## 2019-04-15 MED ORDER — TRAZODONE HCL 50 MG PO TABS
25.0000 mg | ORAL_TABLET | Freq: Every evening | ORAL | Status: DC | PRN
  Administered 2019-04-16: 25 mg via ORAL
  Filled 2019-04-15: qty 1

## 2019-04-15 MED ORDER — SODIUM CHLORIDE 0.9 % IV SOLN
2.0000 g | INTRAVENOUS | Status: DC
  Administered 2019-04-16 – 2019-04-17 (×2): 2 g via INTRAVENOUS
  Filled 2019-04-15: qty 20
  Filled 2019-04-15: qty 2

## 2019-04-15 MED ORDER — ONDANSETRON HCL 4 MG PO TABS: 4.0000 mg | ORAL_TABLET | Freq: Four times a day (QID) | ORAL | Status: DC | PRN

## 2019-04-15 NOTE — H&P (Signed)
[facility] [place] NAME: Katrina Chambers    MR#:  [id number]  DATE OF BIRTH:  20-Jun-1934  DATE OF ADMISSION:  05/06/2019  PRIMARY CARE PHYSICIAN: [facility]   REQUESTING/REFERRING PHYSICIAN: Duffy Bruce, MD  CHIEF COMPLAINT:   Chief Complaint  Patient presents with  . Weakness    HISTORY OF PRESENT ILLNESS:  Katrina Chambers  is a 85 y.o. African-American female with a known history of type 2 diabetes mellitus and chronic kidney disease, who presented to the emergency room with acute onset of generalized weakness and increasing confusion.  She was not able to ambulate from generalized weakness today.  The patient did not have any reported fever or chills, nausea or vomiting.   She cannot give me any history.  I called her son was with her earlier in emergency room and he did not answer.  Per the ER physician, the patient has gone over the last week from being fairly independent and able to ambulate without much help to essentially being unable to walk.  She has become incontinent of urine.  She has refused to eat or drink.  She has been having dyspnea as well as moderate cough.  She was only oriented to person but not to place or time.  Her dyspnea has been more with exertion.  No abdominal pain.  No melena or bright red bleeding per rectum.  No dysuria, oliguria or hematuria or flank pain.  Upon presentation to the emergency room blood pressure was 106/37 with otherwise normal vital signs.  Blood pressure later on was 92/39 and he was tachypneic to 24.  Labs revealed a blood glucose of 321 and a BUN of 64 with creatinine of 2.63.  CBC showed anemia with hemoglobin of 8.7 hematocrit 29.4 likely of chronic kidney disease.  Portable chest x-ray showed patchy predominantly peripheral airspace disease in the left lung concerning for pneumonia.  Influenza antigens and COVID-19 PCR came back negative. EKG showed sinus rhythm with rate of 72 with left bundle  branch block and PVCs.  The patient was given 1 L bolus of IV normal saline, IV Rocephin and Zithromax.  She will be admitted to a medically monitored bed for further evaluation and management. PAST MEDICAL HISTORY:   Past Medical History:  Diagnosis Date  . Diabetes ([place])   . Kidney disease     PAST SURGICAL HISTORY:   Past Surgical History:  Procedure Laterality Date  . BREAST SURGERY Right   . CARDIAC SURGERY    . STOMACH SURGERY      SOCIAL HISTORY:   Social History   Tobacco Use  . Smoking status: Never Smoker  . Smokeless tobacco: Never Used  Substance Use Topics  . Alcohol use: No    FAMILY HISTORY:  History reviewed. No pertinent family history.  DRUG ALLERGIES:  No Known Allergies  REVIEW OF SYSTEMS:   ROS As per history of present illness. All pertinent systems were reviewed above. Constitutional,  HEENT, cardiovascular, respiratory, GI, GU, musculoskeletal, neuro, psychiatric, endocrine,  integumentary and hematologic systems were reviewed and are otherwise  negative/unremarkable except for positive findings mentioned above in the HPI.   MEDICATIONS AT HOME:   Prior to Admission medications   Medication Sig Start Date End Date Taking? Authorizing Provider  albuterol (VENTOLIN HFA) 108 (90 Base) MCG/ACT inhaler 2 puffs q4hr prn 11/24/18  Yes [provider]  budesonide-formoterol (SYMBICORT) 160-4.5 MCG/ACT inhaler Take 2 puffs twice a day. 11/24/18  Yes [provider]  doxazosin (CARDURA) 2 MG tablet Take 2 mg by mouth daily. 12/17/18  Yes [provider]  enalapril (VASOTEC) 20 MG tablet Take 20 mg by mouth 2 (two) times daily.  11/24/18  Yes [provider]  felodipine (PLENDIL) 10 MG 24 hr tablet Take 10 mg by mouth daily.  11/24/18  Yes [provider]  furosemide (LASIX) 20 MG tablet Take 20 mg by mouth daily.  11/24/18 11/19/19 Yes [provider]  Insulin Glargine (LANTUS SOLOSTAR) 100  UNIT/ML Solostar Pen Inject 10 Units into the skin 2 (two) times daily.  11/24/18  Yes [provider]  metoprolol tartrate (LOPRESSOR) 25 MG tablet Take 25 mg by mouth 2 (two) times daily.  11/24/18  Yes [provider]  simvastatin (ZOCOR) 20 MG tablet Take one tablet at bedtime. 11/24/18  Yes [provider]  spironolactone (ALDACTONE) 50 MG tablet Take 50 mg by mouth daily.  11/24/18  Yes [provider]  aspirin 81 MG EC tablet Take by mouth. 11/24/18   [provider]  ergocalciferol (VITAMIN D2) 50000 UNITS capsule Take 50,000 Units by mouth once a week.    [provider]  Fluticasone-Salmeterol (ADVAIR) 250-50 MCG/DOSE AEPB Inhale 1 puff into the lungs 2 (two) times daily.    [provider]  hydrALAZINE (APRESOLINE) 10 MG tablet Take 10 mg by mouth 3 (three) times daily.    [provider]  hydrALAZINE (APRESOLINE) 50 MG tablet  05/27/16   [provider]  hydrochlorothiazide (MICROZIDE) 12.5 MG capsule  05/17/18   [provider]  Insulin Pen Needle (FIFTY50 PEN NEEDLES) 31G X 5 MM MISC Inject into the skin. 04/15/16   [provider]  naproxen (NAPROSYN) 500 MG tablet Take 1 tablet (500 mg total) by mouth 2 (two) times daily with a meal. Patient not taking: Reported on 04/24/2019 01/14/15   Carrie Mew, MD  NOVOLOG FLEXPEN 100 UNIT/ML FlexPen  05/15/14   [provider]  sevelamer (RENAGEL) 800 MG tablet Take 800 mg by mouth 3 (three) times daily with meals.    [provider]  sodium bicarbonate 650 MG tablet Take by mouth. 11/24/18   [provider]      VITAL SIGNS:  Blood pressure (!) 121/53, pulse 79, temperature 97.7 F (36.5 C), temperature source Oral, resp. rate 15, height 5\' 6"  (1.676 m), weight 84.5 kg, SpO2 100 %.  PHYSICAL EXAMINATION:  Physical Exam  GENERAL:  85 y.o.-year-old African-American female patient, lying in the bed with no acute  distress.  EYES: Pupils equal, round, reactive to light and accommodation. No scleral icterus. Extraocular muscles intact.  HEENT: Head atraumatic, normocephalic. Oropharynx and nasopharynx clear.  NECK:  Supple, no jugular venous distention. No thyroid enlargement, no tenderness.  LUNGS: Diminished bibasal breath sounds with bibasal and left midlung zone crackles. CARDIOVASCULAR: Regular rate and rhythm, S1, S2 normal. No murmurs, rubs, or gallops.  ABDOMEN: Soft, nondistended, nontender. Bowel sounds present. No organomegaly or mass.  EXTREMITIES: No pedal edema, cyanosis, or clubbing.  NEUROLOGIC: Cranial nerves II through XII are intact. Muscle strength 5/5 in all extremities. Sensation intact. Gait not checked.  PSYCHIATRIC: The patient is alert and oriented x 3.  Normal affect and good eye contact. SKIN: No obvious rash, lesion, or ulcer.   LABORATORY PANEL:   CBC Recent Labs  Lab 04/22/2019 1849  WBC 8.3  HGB 8.7*  HCT 29.4*  PLT 138*   ------------------------------------------------------------------------------------------------------------------  Chemistries  Recent  Labs  Lab 04/27/2019 1849  NA 139  K 4.3  CL 113*  CO2 16*  GLUCOSE 321*  BUN 64*  CREATININE 2.63*  CALCIUM 8.3*   ------------------------------------------------------------------------------------------------------------------  Cardiac Enzymes No results for input(s): TROPONINI in the last 168 hours. ------------------------------------------------------------------------------------------------------------------  RADIOLOGY:  CT Head Wo Contrast  Result Date: 05/01/2019 CLINICAL DATA:  New onset of weakness and incontinence. Unable to ambulate. Encephalopathy. EXAM: CT HEAD WITHOUT CONTRAST TECHNIQUE: Contiguous axial images were obtained from the base of the skull through the vertex without intravenous contrast. COMPARISON:  None. FINDINGS: Brain: There is no evidence of acute intracranial  hemorrhage, mass lesion, brain edema or extra-axial fluid collection. Atrophy with prominence of the ventricles and subarachnoid spaces, especially in the temporal lobes. Relatively mild chronic small vessel ischemic changes in the periventricular white matter. There is no CT evidence of acute cortical infarction. Vascular: Prominent intracranial vascular calcifications. No hyperdense vessel identified. Skull: Negative for fracture or focal lesion. Sinuses/Orbits: The visualized paranasal sinuses and mastoid air cells are clear. No orbital abnormalities are seen. Other: None. IMPRESSION: 1. No acute intracranial findings. 2. Moderate atrophy and mild chronic small vessel ischemic changes. Electronically Signed   By: Richardean Sale M.D.   On: 04/24/2019 20:27   DG Chest Portable 1 View  Result Date: 04/07/2019 CLINICAL DATA:  Weakness EXAM: PORTABLE CHEST 1 VIEW COMPARISON:  None. FINDINGS: Heart is upper limits normal in size. Patchy airspace opacities in the left upper lobe and peripherally in the left mid and lower lung. Right lung clear. No effusions or acute bony abnormality. IMPRESSION: Patchy predominantly peripheral airspace disease in the left lung concerning for pneumonia. Electronically Signed   By: Rolm Baptise M.D.   On: 05/05/2019 19:50      IMPRESSION AND PLAN:   1.  Community-acquired left-sided pneumonia. -The patient will be admitted to a medically monitored bed for community-acquired pneumonia. -She will be placed on IV Rocephin and Zithromax.   -Mucolytic therapy will be provided as well as duo nebs q.i.d. and q.4 hours p.r.n.Marland Kitchen   -Sputum Gram stain culture and sensitivity will be obtained. -We will follow blood cultures. -We will hold her Symbicort for now.  2.  Mild early sepsis without severe sepsis or septic shock, secondary to pneumonia, manifested by tachypnea and mild hypotension. -We will follow blood and sputum culture. -The patient will be placed on IV Rocephin and  Zithromax as mentioned above.  3.  Uncontrolled type 2 diabetes mellitus. -The patient will be placed on supplement coverage with NovoLog and hydration with IV normal saline. -We will continue basal coverage  4.  Hypertension. -Her Cardura, Plendil and hydralazine will be continued with holding parameters. -We will hold Vasotec for now and follow BMP with hydration.  5.  Chronic kidney disease possibly stage IIIb -IV.  She could be having acute kidney injury on top. -We will hold nephrotoxins and hydrate with half-normal saline. -We will follow serial BMPs. -We will continue her Renvela and sodium bicarbonate.  6.  Dyslipidemia. -Statin therapy will be resumed.  7.  DVT prophylaxis. -Subcutaneous Lovenox   All the records are reviewed and case discussed with ED provider. The plan of care was discussed in details with the patient (and family). I answered all questions. The patient agreed to proceed with the above mentioned plan. Further management will depend upon hospital course.   CODE STATUS: Full code  Status is: Inpatient  Remains inpatient appropriate because:IV treatments appropriate due to intensity of illness or  inability to take PO.  She will need IV antibiotics and hydration and monitoring of her altered mental status as well as physical therapy for significant generalized weakness.   Dispo: The patient is from: Home              Anticipated d/c is to: Home              Anticipated d/c date is: 3 days              Patient currently is not medically stable to d/c.    TOTAL TIME TAKING CARE OF THIS PATIENT: 55 minutes.    Christel Mormon M.D on 04/14/2019 at 9:55 PM  [facility]   From 7 PM-7 AM, contact night-coverage [email or web address]  CC: Primary care physician; [facility]   Note: This dictation was prepared with Dragon dictation along with smaller phrase technology. Any transcriptional errors that result from this process are  unintentional.

## 2019-04-15 NOTE — ED Provider Notes (Signed)
[facility] Emergency Department Provider Note  ____________________________________________   First MD Initiated Contact with Patient 04/08/2019 1816     (approximate)  I have reviewed the triage vital signs and the nursing notes.   HISTORY  Chief Complaint Weakness    HPI Katrina Chambers is a 85 y.o. female with history of diabetes, chronic kidney disease, here with generalized weakness.  History provided primarily by the patient's son.  Reports that over the last week, the patient is gone from fairly independent and able to ambulate without much help, to essentially unable to ambulate.  She has become increasingly confused.  She has become incontinent of urine.  She has not wanted to eat or drink.  She has complained of shortness of breath and has had a moderate cough.  He denies history of similar episodes.  She denies any complaints on my assessment.  She is oriented to person and place but not time.  She does state she feels mildly short of breath with exertion.  Denies any abdominal pain.  No nausea or vomiting.  No constipation or diarrhea.        Past Medical History:  Diagnosis Date  . Diabetes ([place])   . Kidney disease     Patient Active Problem List   Diagnosis Date Noted  . CAP (community acquired pneumonia) 04/16/2019  . CKD (chronic kidney disease) stage 3, GFR 30-59 ml/min 04/02/2016  . Enrolled in chronic care management 11/06/2015  . Coronary artery disease involving coronary bypass graft without angina pectoris 07/18/2014  . Type 2 diabetes mellitus with stage 4 chronic kidney disease ([place]) 06/29/2012  . Chronic kidney disease, stage IV (severe) ([place]) 04/21/2010  . Moderate persistent asthma without complication 88/50/2774  . Benign hypertension 02/07/2003  . Essential hypertension 02/07/2003    Past Surgical History:  Procedure Laterality Date  . BREAST SURGERY Right   . CARDIAC SURGERY    . STOMACH SURGERY      Prior to  Admission medications   Medication Sig Start Date End Date Taking? Authorizing Provider  albuterol (VENTOLIN HFA) 108 (90 Base) MCG/ACT inhaler 2 puffs q4hr prn 11/24/18  Yes [provider]  budesonide-formoterol (SYMBICORT) 160-4.5 MCG/ACT inhaler Take 2 puffs twice a day. 11/24/18  Yes [provider]  doxazosin (CARDURA) 2 MG tablet Take 2 mg by mouth daily. 12/17/18  Yes [provider]  enalapril (VASOTEC) 20 MG tablet Take 20 mg by mouth 2 (two) times daily.  11/24/18  Yes [provider]  felodipine (PLENDIL) 10 MG 24 hr tablet Take 10 mg by mouth daily.  11/24/18  Yes [provider]  furosemide (LASIX) 20 MG tablet Take 20 mg by mouth daily.  11/24/18 11/19/19 Yes [provider]  Insulin Glargine (LANTUS SOLOSTAR) 100 UNIT/ML Solostar Pen Inject 10 Units into the skin 2 (two) times daily.  11/24/18  Yes [provider]  metoprolol tartrate (LOPRESSOR) 25 MG tablet Take 25 mg by mouth 2 (two) times daily.  11/24/18  Yes [provider]  simvastatin (ZOCOR) 20 MG tablet Take one tablet at bedtime. 11/24/18  Yes [provider]  spironolactone (ALDACTONE) 50 MG tablet Take 50 mg by mouth daily.  11/24/18  Yes [provider]  aspirin 81 MG EC tablet Take by mouth. 11/24/18   [provider]  ergocalciferol (VITAMIN D2) 50000 UNITS capsule Take 50,000 Units by mouth once a week.    [provider]  Fluticasone-Salmeterol (ADVAIR) 250-50 MCG/DOSE AEPB Inhale  1 puff into the lungs 2 (two) times daily.    [provider]  hydrALAZINE (APRESOLINE) 10 MG tablet Take 10 mg by mouth 3 (three) times daily.    [provider]  hydrALAZINE (APRESOLINE) 50 MG tablet  05/27/16   [provider]  hydrochlorothiazide (MICROZIDE) 12.5 MG capsule  05/17/18   [provider]  Insulin Pen Needle (FIFTY50 PEN NEEDLES) 31G X 5 MM MISC Inject into the skin. 04/15/16    [provider]  naproxen (NAPROSYN) 500 MG tablet Take 1 tablet (500 mg total) by mouth 2 (two) times daily with a meal. Patient not taking: Reported on 05/05/2019 01/14/15   Carrie Mew, MD  NOVOLOG FLEXPEN 100 UNIT/ML FlexPen  05/15/14   [provider]  sevelamer (RENAGEL) 800 MG tablet Take 800 mg by mouth 3 (three) times daily with meals.    [provider]  sodium bicarbonate 650 MG tablet Take by mouth. 11/24/18   [provider]    Allergies Patient has no known allergies.  History reviewed. No pertinent family history.  Social History Social History   Tobacco Use  . Smoking status: Never Smoker  . Smokeless tobacco: Never Used  Substance Use Topics  . Alcohol use: No  . Drug use: No    Review of Systems  Review of Systems  Unable to perform ROS: Mental status change     ____________________________________________  PHYSICAL EXAM:      VITAL SIGNS: ED Triage Vitals  Enc Vitals Group     BP 04/23/2019 1820 (!) 106/37     Pulse Rate 05/03/2019 1820 68     Resp 05/05/2019 1820 16     Temp 04/08/2019 1820 97.7 F (36.5 C)     Temp Source 04/21/2019 1820 Oral     SpO2 04/13/2019 1820 98 %     Weight 05/06/2019 1821 186 lb 4.6 oz (84.5 kg)     Height 04/17/2019 1821 5\' 6"  (1.676 m)     Head Circumference --      Peak Flow --      Pain Score 04/07/2019 1821 0     Pain Loc --      Pain Edu? --      Excl. in [place]? --      Physical Exam Vitals and nursing note reviewed.  Constitutional:      General: She is not in acute distress.    Appearance: She is well-developed.  HENT:     Head: Normocephalic and atraumatic.     Mouth/Throat:     Mouth: Mucous membranes are dry.  Eyes:     Conjunctiva/sclera: Conjunctivae normal.  Cardiovascular:     Rate and Rhythm: Normal rate and regular rhythm.     Heart sounds: Normal heart sounds.  Pulmonary:     Effort: No respiratory distress.     Breath sounds: No wheezing.     Comments: Mild  tachypnea, bibasilar, worse on the left rales. Abdominal:     General: There is no distension.  Musculoskeletal:     Cervical back: Neck supple.  Skin:    General: Skin is warm.     Capillary Refill: Capillary refill takes less than 2 seconds.     Findings: No rash.  Neurological:     Mental Status: She is alert and oriented to person, place, and time.     Motor: No abnormal muscle tone.       ____________________________________________   LABS (all labs ordered are listed,  but only abnormal results are displayed)  Labs Reviewed  BASIC METABOLIC PANEL - Abnormal; Notable for the following components:      Result Value   Chloride 113 (*)    CO2 16 (*)    Glucose, Bld 321 (*)    BUN 64 (*)    Creatinine, Ser 2.63 (*)    Calcium 8.3 (*)    GFR calc non Af Amer 16 (*)    GFR calc Af Amer 19 (*)    All other components within normal limits  CBC - Abnormal; Notable for the following components:   RBC 2.99 (*)    Hemoglobin 8.7 (*)    HCT 29.4 (*)    MCHC 29.6 (*)    Platelets 138 (*)    All other components within normal limits  URINALYSIS, COMPLETE (UACMP) WITH MICROSCOPIC - Abnormal; Notable for the following components:   Color, Urine YELLOW (*)    APPearance CLEAR (*)    Bacteria, UA RARE (*)    All other components within normal limits  PHOSPHORUS - Abnormal; Notable for the following components:   Phosphorus 4.8 (*)    All other components within normal limits  RESPIRATORY PANEL BY RT PCR (FLU A&B, COVID)  CULTURE, BLOOD (ROUTINE X 2)  CULTURE, BLOOD (ROUTINE X 2)  EXPECTORATED SPUTUM ASSESSMENT W REFEX TO RESP CULTURE  MAGNESIUM  HIV ANTIBODY (ROUTINE TESTING W REFLEX)  BASIC METABOLIC PANEL  CBC  LEGIONELLA PNEUMOPHILA SEROGP 1 UR AG  STREP PNEUMONIAE URINARY ANTIGEN  CBG MONITORING, ED    ____________________________________________  EKG: Normal sinus rhythm, VR 72. QRS 175 with LBBB. QTc 487. PVCs noted.   ________________________________________  RADIOLOGY All imaging, including plain films, CT scans, and ultrasounds, independently reviewed by me, and interpretations confirmed via formal radiology reads.  ED MD interpretation:   CT Head: NAICA CXR: Left lower PNA  Official radiology report(s): CT Head Wo Contrast  Result Date: 05/06/2019 CLINICAL DATA:  New onset of weakness and incontinence. Unable to ambulate. Encephalopathy. EXAM: CT HEAD WITHOUT CONTRAST TECHNIQUE: Contiguous axial images were obtained from the base of the skull through the vertex without intravenous contrast. COMPARISON:  None. FINDINGS: Brain: There is no evidence of acute intracranial hemorrhage, mass lesion, brain edema or extra-axial fluid collection. Atrophy with prominence of the ventricles and subarachnoid spaces, especially in the temporal lobes. Relatively mild chronic small vessel ischemic changes in the periventricular white matter. There is no CT evidence of acute cortical infarction. Vascular: Prominent intracranial vascular calcifications. No hyperdense vessel identified. Skull: Negative for fracture or focal lesion. Sinuses/Orbits: The visualized paranasal sinuses and mastoid air cells are clear. No orbital abnormalities are seen. Other: None. IMPRESSION: 1. No acute intracranial findings. 2. Moderate atrophy and mild chronic small vessel ischemic changes. Electronically Signed   By: Richardean Sale M.D.   On: 04/13/2019 20:27   DG Chest Portable 1 View  Result Date: 04/24/2019 CLINICAL DATA:  Weakness EXAM: PORTABLE CHEST 1 VIEW COMPARISON:  None. FINDINGS: Heart is upper limits normal in size. Patchy airspace opacities in the left upper lobe and peripherally in the left mid and lower lung. Right lung clear. No effusions or acute bony abnormality. IMPRESSION: Patchy predominantly peripheral airspace disease in the left lung concerning for pneumonia. Electronically Signed   By: Rolm Baptise M.D.   On: 04/19/2019  19:50    ____________________________________________  PROCEDURES   Procedure(s) performed (including Critical Care):  Procedures  ____________________________________________  INITIAL IMPRESSION / MDM / ASSESSMENT AND PLAN /  ED COURSE  As part of my medical decision making, I reviewed the following data within the [place] notes reviewed and incorporated, Old chart reviewed, Notes from prior ED visits, and [place] Controlled Substance Rosita was evaluated in Emergency Department on 04/16/2019 for the symptoms described in the history of present illness. She was evaluated in the context of the global COVID-19 pandemic, which necessitated consideration that the patient might be at risk for infection with the SARS-CoV-2 virus that causes COVID-19. Institutional protocols and algorithms that pertain to the evaluation of patients at risk for COVID-19 are in a state of rapid change based on information released by regulatory bodies including the CDC and federal and state organizations. These policies and algorithms were followed during the patient's care in the ED.  Some ED evaluations and interventions may be delayed as a result of limited staffing during the pandemic.*     Medical Decision Making:  85 yo F here with weakness, confusion. On arrival, pt appears mildly confused, encephalopathic, and dehydrated. CXR shows left-sided PNA. COVID negative. Pt does have +cough. No hypoxia, leukocytosis however or signs of severe sepsis. IVF, Rocephin/Azithro started. Admit.  ____________________________________________  FINAL CLINICAL IMPRESSION(S) / ED DIAGNOSES  Final diagnoses:  Community acquired pneumonia of left lower lobe of lung  Weakness     MEDICATIONS GIVEN DURING THIS VISIT:  Medications  aspirin EC tablet 81 mg (has no administration in time range)  simvastatin (ZOCOR) tablet 20 mg (has no administration in time range)   insulin glargine (LANTUS) injection 10 Units (has no administration in time range)  sevelamer carbonate (RENVELA) tablet 800 mg (has no administration in time range)  ipratropium-albuterol (DUONEB) 0.5-2.5 (3) MG/3ML nebulizer solution 3 mL (3 mLs Nebulization Not Given 04/27/2019 2247)  guaiFENesin (MUCINEX) 12 hr tablet 600 mg (has no administration in time range)  chlorpheniramine-HYDROcodone (TUSSIONEX) 10-8 MG/5ML suspension 5 mL (has no administration in time range)  0.9 %  sodium chloride infusion (has no administration in time range)  acetaminophen (TYLENOL) tablet 650 mg (has no administration in time range)    Or  acetaminophen (TYLENOL) suppository 650 mg (has no administration in time range)  traZODone (DESYREL) tablet 25 mg (has no administration in time range)  ondansetron (ZOFRAN) tablet 4 mg (has no administration in time range)    Or  ondansetron (ZOFRAN) injection 4 mg (has no administration in time range)  cefTRIAXone (ROCEPHIN) 2 g in sodium chloride 0.9 % 100 mL IVPB (has no administration in time range)  azithromycin (ZITHROMAX) 500 mg in sodium chloride 0.9 % 250 mL IVPB (has no administration in time range)  heparin injection 5,000 Units (has no administration in time range)  cefTRIAXone (ROCEPHIN) 2 g in sodium chloride 0.9 % 100 mL IVPB (0 g Intravenous Stopped 05/01/2019 2138)  azithromycin (ZITHROMAX) 500 mg in sodium chloride 0.9 % 250 mL IVPB (0 mg Intravenous Stopped 04/24/2019 2259)  sodium chloride 0.9 % bolus 1,000 mL (0 mLs Intravenous Stopped 04/27/2019 2201)     ED Discharge Orders    None       Note:  This document was prepared using Dragon voice recognition software and may include unintentional dictation errors.   Duffy Bruce, MD 04/16/19 [phone]

## 2019-04-15 NOTE — ED Notes (Signed)
To bedside as 1:1 sitter.

## 2019-04-15 NOTE — ED Notes (Signed)
ED TO INPATIENT HANDOFF REPORT  ED Nurse Name and Phone #: [place] Levels Name/Age/Gender Katrina Chambers 85 y.o. female Room/Bed: ED02A/ED02A  Code Status   Code Status: Full Code  Home/SNF/Other Home Patient oriented to: self and place Is this baseline? No   Triage Complete: Triage complete  Chief Complaint CAP (community acquired pneumonia) [J18.9]  Triage Note Pt to ED via [facility] from home. Per EMS pt at baseline is A&Ox4, independent and ambulatory. Per EMS pt unable to ambulate and complains of new onset of weakness and incontince. With EMS pt A&Ox3. Pt disoriented to year.   EMS VS: CBG 387 BP 113/45 (trending down to systolic in 81O) 18 g LAC Monitor showed left bundle branch block  Upon arrival pt A&Ox3. Pt denies any pain. Pt stating she started to feel weak this morning.     Allergies No Known Allergies  Level of Care/Admitting Diagnosis ED Disposition    ED Disposition Condition [facility] Area: [place] [[id number]]  Level of Care: Med-Surg [16]  Covid Evaluation: Confirmed COVID Negative  Diagnosis: CAP (community acquired pneumonia) [175102]  Admitting Physician: Christel Mormon [[id number]]  Attending Physician: Christel Mormon [[id number]]  Estimated length of stay: past midnight tomorrow  Certification:: I certify this patient will need inpatient services for at least 2 midnights       B Medical/Surgery History Past Medical History:  Diagnosis Date  . Diabetes ([place])   . Kidney disease    Past Surgical History:  Procedure Laterality Date  . BREAST SURGERY Right   . CARDIAC SURGERY    . STOMACH SURGERY       A IV Location/Drains/Wounds Patient Lines/Drains/Airways Status   Active Line/Drains/Airways    Name:   Placement date:   Placement time:   Site:   Days:   Peripheral IV 05/03/2019 Left Antecubital   04/16/2019    2045    Antecubital   less than 1   Peripheral IV 04/25/2019 Left Arm   04/14/2019    --    Arm   less  than 1          Intake/Output Last 24 hours No intake or output data in the 24 hours ending 04/08/2019 2315  Labs/Imaging Results for orders placed or performed during the hospital encounter of 04/08/2019 (from the past 48 hour(s))  Basic metabolic panel     Status: Abnormal   Collection Time: 04/07/2019  6:49 PM  Result Value Ref Range   Sodium 139 135 - 145 mmol/L   Potassium 4.3 3.5 - 5.1 mmol/L   Chloride 113 (H) 98 - 111 mmol/L   CO2 16 (L) 22 - 32 mmol/L   Glucose, Bld 321 (H) 70 - 99 mg/dL    Comment: Glucose reference range applies only to samples taken after fasting for at least 8 hours.   BUN 64 (H) 8 - 23 mg/dL   Creatinine, Ser 2.63 (H) 0.44 - 1.00 mg/dL   Calcium 8.3 (L) 8.9 - 10.3 mg/dL   GFR calc non Af Amer 16 (L) >60 mL/min   GFR calc Af Amer 19 (L) >60 mL/min   Anion gap 10 5 - 15    Comment: Performed at [facility], [address] [place], [place] [address]  CBC     Status: Abnormal   Collection Time: 04/24/2019  6:49 PM  Result Value Ref Range   WBC 8.3 4.0 - 10.5 K/uL   RBC 2.99 (  L) 3.87 - 5.11 MIL/uL   Hemoglobin 8.7 (L) 12.0 - 15.0 g/dL   HCT 29.4 (L) 36.0 - 46.0 %   MCV 98.3 80.0 - 100.0 fL   MCH 29.1 26.0 - 34.0 pg   MCHC 29.6 (L) 30.0 - 36.0 g/dL   RDW 13.7 11.5 - 15.5 %   Platelets 138 (L) 150 - 400 K/uL   nRBC 0.0 0.0 - 0.2 %    Comment: Performed at [facility], [address] [place], [place] [address]  Urinalysis, Complete w Microscopic     Status: Abnormal   Collection Time: 04/12/2019  7:36 PM  Result Value Ref Range   Color, Urine YELLOW (A) YELLOW   APPearance CLEAR (A) CLEAR   Specific Gravity, Urine 1.012 1.005 - 1.030   pH 5.0 5.0 - 8.0   Glucose, UA NEGATIVE NEGATIVE mg/dL   Hgb urine dipstick NEGATIVE NEGATIVE   Bilirubin Urine NEGATIVE NEGATIVE   Ketones, ur NEGATIVE NEGATIVE mg/dL   Protein, ur NEGATIVE NEGATIVE mg/dL   Nitrite NEGATIVE NEGATIVE   Leukocytes,Ua NEGATIVE NEGATIVE   RBC / HPF 0-5 0 - 5  RBC/hpf   WBC, UA 0-5 0 - 5 WBC/hpf   Bacteria, UA RARE (A) NONE SEEN   Squamous Epithelial / LPF NONE SEEN 0 - 5    Comment: Performed at [facility], [address], [place], [place] [address]  Respiratory Panel by RT PCR (Flu A&B, Covid) - Nasopharyngeal Swab     Status: None   Collection Time: 05/01/2019  8:40 PM   Specimen: Nasopharyngeal Swab  Result Value Ref Range   SARS Coronavirus 2 by RT PCR NEGATIVE NEGATIVE    Comment: (NOTE) SARS-CoV-2 target nucleic acids are NOT DETECTED. The SARS-CoV-2 RNA is generally detectable in upper respiratoy specimens during the acute phase of infection. The lowest concentration of SARS-CoV-2 viral copies this assay can detect is 131 copies/mL. A negative result does not preclude SARS-Cov-2 infection and should not be used as the sole basis for treatment or other patient management decisions. A negative result may occur with  improper specimen collection/handling, submission of specimen other than nasopharyngeal swab, presence of viral mutation(s) within the areas targeted by this assay, and inadequate number of viral copies (<131 copies/mL). A negative result must be combined with clinical observations, patient history, and epidemiological information. The expected result is Negative. Fact Sheet for Patients:  PinkCheek.be Fact Sheet for Healthcare Providers:  GravelBags.it This test is not yet ap proved or cleared by the [place] FDA and  has been authorized for detection and/or diagnosis of SARS-CoV-2 by FDA under an Emergency Use Authorization (EUA). This EUA will remain  in effect (meaning this test can be used) for the duration of the COVID-19 declaration under Section 564(b)(1) of the Act, 21 U.S.C. section 360bbb-3(b)(1), unless the authorization is terminated or revoked sooner.    Influenza A by PCR NEGATIVE NEGATIVE   Influenza B by PCR NEGATIVE NEGATIVE     Comment: (NOTE) The Xpert Xpress SARS-CoV-2/FLU/RSV assay is intended as an aid in  the diagnosis of influenza from Nasopharyngeal swab specimens and  should not be used as a sole basis for treatment. Nasal washings and  aspirates are unacceptable for Xpert Xpress SARS-CoV-2/FLU/RSV  testing. Fact Sheet for Patients: PinkCheek.be Fact Sheet for Healthcare Providers: GravelBags.it This test is not yet approved or cleared by the [place] FDA and  has been authorized for detection and/or diagnosis of SARS-CoV-2 by  FDA under an  Emergency Use Authorization (EUA). This EUA will remain  in effect (meaning this test can be used) for the duration of the  Covid-19 declaration under Section 564(b)(1) of the Act, 21  U.S.C. section 360bbb-3(b)(1), unless the authorization is  terminated or revoked. Performed at [facility], [place], [place] [address]    CT Head Wo Contrast  Result Date: 05/01/2019 CLINICAL DATA:  New onset of weakness and incontinence. Unable to ambulate. Encephalopathy. EXAM: CT HEAD WITHOUT CONTRAST TECHNIQUE: Contiguous axial images were obtained from the base of the skull through the vertex without intravenous contrast. COMPARISON:  None. FINDINGS: Brain: There is no evidence of acute intracranial hemorrhage, mass lesion, brain edema or extra-axial fluid collection. Atrophy with prominence of the ventricles and subarachnoid spaces, especially in the temporal lobes. Relatively mild chronic small vessel ischemic changes in the periventricular white matter. There is no CT evidence of acute cortical infarction. Vascular: Prominent intracranial vascular calcifications. No hyperdense vessel identified. Skull: Negative for fracture or focal lesion. Sinuses/Orbits: The visualized paranasal sinuses and mastoid air cells are clear. No orbital abnormalities are seen. Other: None. IMPRESSION: 1. No acute  intracranial findings. 2. Moderate atrophy and mild chronic small vessel ischemic changes. Electronically Signed   By: Richardean Sale M.D.   On: 04/17/2019 20:27   DG Chest Portable 1 View  Result Date: 05/04/2019 CLINICAL DATA:  Weakness EXAM: PORTABLE CHEST 1 VIEW COMPARISON:  None. FINDINGS: Heart is upper limits normal in size. Patchy airspace opacities in the left upper lobe and peripherally in the left mid and lower lung. Right lung clear. No effusions or acute bony abnormality. IMPRESSION: Patchy predominantly peripheral airspace disease in the left lung concerning for pneumonia. Electronically Signed   By: Rolm Baptise M.D.   On: 04/20/2019 19:50    Pending Labs Unresulted Labs (From admission, onward)    Start     Ordered   04/16/19 0973  Basic metabolic panel  Tomorrow morning,   STAT     04/20/2019 2154   04/16/19 0500  CBC  Tomorrow morning,   STAT     04/22/2019 2154   04/11/2019 2301  Phosphorus  Add-on,   AD     04/29/2019 2300   04/14/2019 2242  Magnesium  Once,   STAT     04/20/2019 2241   05/01/2019 2152  Culture, sputum-assessment  Once,   STAT     04/09/2019 2154   04/22/2019 2152  Legionella Pneumophila Serogp 1 Ur Ag  Once,   STAT     04/16/2019 2154   04/27/2019 2152  Strep pneumoniae urinary antigen  Once,   STAT     04/20/2019 2154   04/08/2019 2149  HIV Antibody (routine testing w rflx)  (HIV Antibody (Routine testing w reflex) panel)  Once,   STAT     04/30/2019 2154   05/06/2019 2009  Blood culture (routine x 2)  BLOOD CULTURE X 2,   STAT     05/06/2019 2008          Vitals/Pain Today's Vitals   05/02/2019 2130 04/14/2019 2230 04/16/2019 2300 04/26/2019 2312  BP: (!) 121/53 (!) 94/47 (!) 97/48   Pulse: 79 81 83 82  Resp: 15 14  19   Temp:      TempSrc:      SpO2: 100% 100% 99% 100%  Weight:      Height:      PainSc:        Isolation Precautions No active isolations  Medications Medications  aspirin EC tablet 81 mg (has no administration in time range)  simvastatin (ZOCOR)  tablet 20 mg (has no administration in time range)  insulin glargine (LANTUS) injection 10 Units (has no administration in time range)  sevelamer carbonate (RENVELA) tablet 800 mg (has no administration in time range)  ipratropium-albuterol (DUONEB) 0.5-2.5 (3) MG/3ML nebulizer solution 3 mL (3 mLs Nebulization Not Given 04/13/2019 2247)  guaiFENesin (MUCINEX) 12 hr tablet 600 mg (has no administration in time range)  chlorpheniramine-HYDROcodone (TUSSIONEX) 10-8 MG/5ML suspension 5 mL (has no administration in time range)  0.9 %  sodium chloride infusion (has no administration in time range)  acetaminophen (TYLENOL) tablet 650 mg (has no administration in time range)    Or  acetaminophen (TYLENOL) suppository 650 mg (has no administration in time range)  traZODone (DESYREL) tablet 25 mg (has no administration in time range)  ondansetron (ZOFRAN) tablet 4 mg (has no administration in time range)    Or  ondansetron (ZOFRAN) injection 4 mg (has no administration in time range)  cefTRIAXone (ROCEPHIN) 2 g in sodium chloride 0.9 % 100 mL IVPB (has no administration in time range)  azithromycin (ZITHROMAX) 500 mg in sodium chloride 0.9 % 250 mL IVPB (has no administration in time range)  heparin injection 5,000 Units (has no administration in time range)  cefTRIAXone (ROCEPHIN) 2 g in sodium chloride 0.9 % 100 mL IVPB (0 g Intravenous Stopped 04/23/2019 2138)  azithromycin (ZITHROMAX) 500 mg in sodium chloride 0.9 % 250 mL IVPB (0 mg Intravenous Stopped 05/04/2019 2259)  sodium chloride 0.9 % bolus 1,000 mL (0 mLs Intravenous Stopped 04/29/2019 2201)    Mobility walks with person assist Low fall risk   Focused Assessments Pulmonary Assessment Handoff:  Lung sounds:   O2 Device: Room Air        R Recommendations: See Admitting Provider Note  Report given to:   Additional Notes:

## 2019-04-15 NOTE — ED Notes (Signed)
UA obtained by EDT Jan and myself by straight cath.  Purewick put in place.

## 2019-04-15 NOTE — ED Notes (Signed)
Patient transported to CT 

## 2019-04-15 NOTE — ED Triage Notes (Signed)
Pt to ED via [facility] from home. Per EMS pt at baseline is A&Ox4, independent and ambulatory. Per EMS pt unable to ambulate and complains of new onset of weakness and incontince. With EMS pt A&Ox3. Pt disoriented to year.   EMS VS: CBG 387 BP 113/45 (trending down to systolic in 70L) 18 g LAC Monitor showed left bundle branch block  Upon arrival pt A&Ox3. Pt denies any pain. Pt stating she started to feel weak this morning.

## 2019-04-15 NOTE — ED Notes (Signed)
Called and updated son

## 2019-04-16 ENCOUNTER — Encounter: Payer: Self-pay | Admitting: Family Medicine

## 2019-04-16 DIAGNOSIS — N184 Chronic kidney disease, stage 4 (severe): Secondary | ICD-10-CM

## 2019-04-16 DIAGNOSIS — R4182 Altered mental status, unspecified: Secondary | ICD-10-CM

## 2019-04-16 DIAGNOSIS — F0390 Unspecified dementia without behavioral disturbance: Secondary | ICD-10-CM

## 2019-04-16 DIAGNOSIS — I4891 Unspecified atrial fibrillation: Secondary | ICD-10-CM

## 2019-04-16 DIAGNOSIS — R531 Weakness: Secondary | ICD-10-CM

## 2019-04-16 DIAGNOSIS — J189 Pneumonia, unspecified organism: Secondary | ICD-10-CM

## 2019-04-16 LAB — BASIC METABOLIC PANEL
Anion gap: 10 (ref 5–15)
BUN: 61 mg/dL — ABNORMAL HIGH (ref 8–23)
CO2: 16 mmol/L — ABNORMAL LOW (ref 22–32)
Calcium: 8.3 mg/dL — ABNORMAL LOW (ref 8.9–10.3)
Chloride: 116 mmol/L — ABNORMAL HIGH (ref 98–111)
Creatinine, Ser: 2.66 mg/dL — ABNORMAL HIGH (ref 0.44–1.00)
GFR calc Af Amer: 18 mL/min — ABNORMAL LOW (ref >60)
GFR calc non Af Amer: 16 mL/min — ABNORMAL LOW (ref >60)
Glucose, Bld: 310 mg/dL — ABNORMAL HIGH (ref 70–99)
Potassium: 4.2 mmol/L (ref 3.5–5.1)
Sodium: 142 mmol/L (ref 135–145)

## 2019-04-16 LAB — HIV ANTIBODY (ROUTINE TESTING W REFLEX): HIV Screen 4th Generation wRfx: NONREACTIVE

## 2019-04-16 LAB — TROPONIN I (HIGH SENSITIVITY)
Troponin I (High Sensitivity): 17 ng/L (ref <18)
Troponin I (High Sensitivity): 20 ng/L — ABNORMAL HIGH (ref <18)

## 2019-04-16 LAB — CBC
HCT: 27.6 % — ABNORMAL LOW (ref 36.0–46.0)
Hemoglobin: 8.4 g/dL — ABNORMAL LOW (ref 12.0–15.0)
MCH: 29.2 pg (ref 26.0–34.0)
MCHC: 30.4 g/dL (ref 30.0–36.0)
MCV: 95.8 fL (ref 80.0–100.0)
Platelets: 132 10*3/uL — ABNORMAL LOW (ref 150–400)
RBC: 2.88 MIL/uL — ABNORMAL LOW (ref 3.87–5.11)
RDW: 13.9 % (ref 11.5–15.5)
WBC: 8.3 10*3/uL (ref 4.0–10.5)
nRBC: 0.2 % (ref 0.0–0.2)

## 2019-04-16 LAB — PROCALCITONIN: Procalcitonin: <0.1 ng/mL

## 2019-04-16 LAB — GLUCOSE, CAPILLARY: Glucose-Capillary: 350 mg/dL — ABNORMAL HIGH (ref 70–99)

## 2019-04-16 LAB — MAGNESIUM: Magnesium: 1.8 mg/dL (ref 1.7–2.4)

## 2019-04-16 LAB — PHOSPHORUS: Phosphorus: 4.8 mg/dL — ABNORMAL HIGH (ref 2.5–4.6)

## 2019-04-16 MED ORDER — SODIUM CHLORIDE 0.9 % IV BOLUS
1000.0000 mL | Freq: Once | INTRAVENOUS | Status: AC
  Administered 2019-04-16: 1000 mL via INTRAVENOUS

## 2019-04-16 MED ORDER — SODIUM CHLORIDE 0.9 % IV BOLUS
500.0000 mL | Freq: Once | INTRAVENOUS | Status: AC
  Administered 2019-04-16: 500 mL via INTRAVENOUS

## 2019-04-16 MED ORDER — DIGOXIN 0.25 MG/ML IJ SOLN
0.0625 mg | INTRAMUSCULAR | Status: DC
  Administered 2019-04-16: 0.0625 mg via INTRAVENOUS
  Filled 2019-04-16 (×3): qty 2

## 2019-04-16 MED ORDER — SODIUM BICARBONATE 650 MG PO TABS
650.0000 mg | ORAL_TABLET | Freq: Three times a day (TID) | ORAL | Status: DC
  Administered 2019-04-16: 650 mg via ORAL
  Filled 2019-04-16 (×5): qty 1

## 2019-04-16 MED ORDER — IPRATROPIUM-ALBUTEROL 0.5-2.5 (3) MG/3ML IN SOLN: 3.0000 mL | Freq: Four times a day (QID) | RESPIRATORY_TRACT | Status: DC | PRN

## 2019-04-16 NOTE — Progress Notes (Signed)
This RN spoke with son Coralyn Mark) regarding patient's transfer to room 254. He had no questions at this time.

## 2019-04-16 NOTE — Progress Notes (Signed)
This RN called son Coralyn Mark) to notify him of patient's transfer. Voicemail left for him to call hospital.

## 2019-04-16 NOTE — Progress Notes (Signed)
Patient transferred to [facility]. Report given to Webb Silversmith, RN.

## 2019-04-16 NOTE — Progress Notes (Signed)
This RN called son Trellis Moment) to notify him of patient's transfer. Son had no questions for RN at this time.

## 2019-04-16 NOTE — Consult Note (Signed)
Cardiology Consultation:   Patient ID: Katrina Chambers MRN: [id number]; DOB: [birth year]  Admit date: 05/05/2019 Date of Consult: 04/16/2019  Primary Care Provider: Phys[facility], [facility] Primary Cardiologist: New to [place] rounding Primary Electrophysiologist:  None    Patient Profile:   Katrina Chambers is a 85 y.o. female with a hx of diabetes, CKD, hypertension, hyperlipidemia who is being seen today for the evaluation of atrial fibrillation at the request of Dr. Reesa Chew.  History of Present Illness:   Katrina Chambers is an 85 year old female with history of diabetes, CKD, hypertension, hyperlipidemia who presents due to overall weakness.  History obtained from chart review and staff due to condition of patient.  Patient presented to the emergency room due to acute onset of weakness and increased confusion.  She has difficulty ambulating.  Per family, she has not been eating or drinking over the past couple of days.  No fevers, chills, nausea or vomiting was noted.  Patient also noted to be incontinent of urine.  In the emergency room upon admission, blood pressures were noted to be low with systolic in the 19E, patient was also tachypneic.  BUN and creatinine were elevated.  Chest x-ray showed patchy peripheral airspace disease in the left lung concerning for pneumonia.  Head CT did not show any acute intracranial findings.  Patient was admitted with diagnosis of pneumonia and started on IV antibiotics and fluids.  EKG on admission was regular, left bundle branch block, could not determine if sinus due to difficult to ascertain P waves.  Follow-up EKG today showed atrial fibrillation with rapid ventricular response, heart rate 118.  Upon my exam, patient was moaning in bed, no coherent words noted.  Note states patient has history of advanced dementia.   Past Medical History:  Diagnosis Date  . Diabetes ([place])   . Kidney disease     Past Surgical History:  Procedure Laterality  Date  . BREAST SURGERY Right   . CARDIAC SURGERY    . STOMACH SURGERY       Home Medications:  Prior to Admission medications   Medication Sig Start Date End Date Taking? Authorizing Provider  albuterol (VENTOLIN HFA) 108 (90 Base) MCG/ACT inhaler 2 puffs q4hr prn 11/24/18  Yes [provider]  budesonide-formoterol (SYMBICORT) 160-4.5 MCG/ACT inhaler Take 2 puffs twice a day. 11/24/18  Yes [provider]  doxazosin (CARDURA) 2 MG tablet Take 2 mg by mouth daily. 12/17/18  Yes [provider]  enalapril (VASOTEC) 20 MG tablet Take 20 mg by mouth 2 (two) times daily.  11/24/18  Yes [provider]  felodipine (PLENDIL) 10 MG 24 hr tablet Take 10 mg by mouth daily.  11/24/18  Yes [provider]  furosemide (LASIX) 20 MG tablet Take 20 mg by mouth daily.  11/24/18 11/19/19 Yes [provider]  Insulin Glargine (LANTUS SOLOSTAR) 100 UNIT/ML Solostar Pen Inject 10 Units into the skin 2 (two) times daily.  11/24/18  Yes [provider]  metoprolol tartrate (LOPRESSOR) 25 MG tablet Take 25 mg by mouth 2 (two) times daily.  11/24/18  Yes [provider]  simvastatin (ZOCOR) 20 MG tablet Take one tablet at bedtime. 11/24/18  Yes [provider]  spironolactone (ALDACTONE) 50 MG tablet Take 50 mg by mouth daily.  11/24/18  Yes [provider]  aspirin 81 MG EC tablet Take by mouth. 11/24/18   [provider]  ergocalciferol (VITAMIN D2) 50000 UNITS capsule Take 50,000 Units by mouth once a  week.    [provider]  Fluticasone-Salmeterol (ADVAIR) 250-50 MCG/DOSE AEPB Inhale 1 puff into the lungs 2 (two) times daily.    [provider]  hydrALAZINE (APRESOLINE) 10 MG tablet Take 10 mg by mouth 3 (three) times daily.    [provider]  hydrALAZINE (APRESOLINE) 50 MG tablet  05/27/16   [provider]  hydrochlorothiazide (MICROZIDE) 12.5 MG capsule  05/17/18   [provider]  Insulin Pen Needle (FIFTY50 PEN NEEDLES) 31G X 5 MM MISC Inject into the skin. 04/15/16   [provider]  naproxen (NAPROSYN) 500 MG tablet Take 1 tablet (500 mg total) by mouth 2 (two) times daily with a meal. Patient not taking: Reported on 04/29/2019 01/14/15   Katrina Mew, MD  NOVOLOG FLEXPEN 100 UNIT/ML FlexPen  05/15/14   [provider]  sevelamer (RENAGEL) 800 MG tablet Take 800 mg by mouth 3 (three) times daily with meals.    [provider]  sodium bicarbonate 650 MG tablet Take by mouth. 11/24/18   [provider]    Inpatient Medications: Scheduled Meds: . aspirin EC  81 mg Oral Daily  . guaiFENesin  600 mg Oral BID  . heparin injection (subcutaneous)  5,000 Units Subcutaneous Q8H  . insulin glargine  10 Units Subcutaneous QHS  . ipratropium-albuterol  3 mL Nebulization QID  . sevelamer carbonate  800 mg Oral TID WC  . simvastatin  20 mg Oral QHS  . sodium bicarbonate  650 mg Oral TID   Continuous Infusions: . sodium chloride 100 mL/hr at 04/16/19 0514  . azithromycin    . cefTRIAXone (ROCEPHIN)  IV    . sodium chloride     PRN Meds: acetaminophen **OR** acetaminophen, chlorpheniramine-HYDROcodone, ondansetron **OR** ondansetron (ZOFRAN) IV, traZODone  Allergies:   No Known Allergies  Social History:   Social History   Socioeconomic History  . Marital status: Widowed    Spouse name: Not on file  . Number of children: Not on file  . Years of education: Not on file  . Highest education level: Not on file  Occupational History  . Not on file  Tobacco Use  . Smoking status: Never Smoker  . Smokeless tobacco: Never Used  Substance and Sexual Activity  . Alcohol use: No  . Drug use: No  . Sexual activity: Not on file  Other Topics Concern  . Not on file  Social History Narrative  . Not on file   Social Determinants of Health   Financial Resource Strain:   . Difficulty of Paying Living Expenses:   Food  Insecurity:   . Worried About Charity fundraiser in the Last Year:   . Arboriculturist in the Last Year:   Transportation Needs:   . Film/video editor (Medical):   Marland Kitchen Lack of Transportation (Non-Medical):   Physical Activity:   . Days of Exercise per Week:   . Minutes of Exercise per Session:   Stress:   . Feeling of Stress :   Social Connections:   . Frequency of Communication with Friends and Family:   . Frequency of Social Gatherings with Friends and Family:   . Attends Religious Services:   . Active Member of Clubs or Organizations:   . Attends Archivist Meetings:   Marland Kitchen Marital Status:   Intimate Partner Violence:   . Fear of Current or Ex-Partner:   . Emotionally Abused:   Marland Kitchen Physically Abused:   . Sexually Abused:  Family History:   History reviewed. No pertinent family history.   ROS:  Please see the history of present illness.   All other ROS reviewed and negative.     Physical Exam/Data:   Vitals:   04/16/19 0754 04/16/19 1100 04/16/19 1200 04/16/19 1329  BP: (!) 102/42  (!) 96/35 (!) 86/34  Pulse: 91 (!) 141 (!) 59   Resp: 16 17 17 18   Temp: 97.6 F (36.4 C)   98 F (36.7 C)  TempSrc: Oral   Oral  SpO2: 98% 98% 97%   Weight:      Height:        Intake/Output Summary (Last 24 hours) at 04/16/2019 1510 Last data filed at 04/16/2019 1412 Gross per 24 hour  Intake 240 ml  Output --  Net 240 ml   Last 3 Weights 04/27/2019 01/30/2016 12/03/2015  Weight (lbs) 186 lb 4.6 oz 187 lb 208 lb  Weight (kg) 84.5 kg 84.823 kg 94.348 kg     Body mass index is 30.07 kg/m.  General: Appears very somnolent, moaning at times, lunch on the side of her bed untouched, HEENT: normal Lymph: no adenopathy Neck: no JVD Endocrine:  No thryomegaly Vascular: No carotid bruits; FA pulses 2+ bilaterally without bruits  Cardiac: Irregular irregular, tachycardic Lungs: Poor inspiratory effort, decreased breath sounds at bases Abd: soft, nontender, no  hepatomegaly  Ext: no edema Musculoskeletal:  No deformities,  Skin: warm and dry  Neuro: Unable to assess Psych: Unable to assess  EKG:  The EKG was personally reviewed and demonstrates: Atrial fibrillation, left bundle branch block heart rate 118 Telemetry:  Telemetry was personally reviewed and demonstrates: Atrial fibrillation  Relevant CV Studies: Echocardiogram was ordered  Laboratory Data:  High Sensitivity Troponin:   Recent Labs  Lab 04/16/19 0909 04/16/19 1115  TROPONINIHS 17 20*     Chemistry Recent Labs  Lab 04/28/2019 1849 04/16/19 0553  NA 139 142  K 4.3 4.2  CL 113* 116*  CO2 16* 16*  GLUCOSE 321* 310*  BUN 64* 61*  CREATININE 2.63* 2.66*  CALCIUM 8.3* 8.3*  GFRNONAA 16* 16*  GFRAA 19* 18*  ANIONGAP 10 10    No results for input(s): PROT, ALBUMIN, AST, ALT, ALKPHOS, BILITOT in the last 168 hours. Hematology Recent Labs  Lab 05/02/2019 1849 04/16/19 0553  WBC 8.3 8.3  RBC 2.99* 2.88*  HGB 8.7* 8.4*  HCT 29.4* 27.6*  MCV 98.3 95.8  MCH 29.1 29.2  MCHC 29.6* 30.4  RDW 13.7 13.9  PLT 138* 132*   BNPNo results for input(s): BNP, PROBNP in the last 168 hours.  DDimer No results for input(s): DDIMER in the last 168 hours.   Radiology/Studies:  CT Head Wo Contrast  Result Date: 04/14/2019 CLINICAL DATA:  New onset of weakness and incontinence. Unable to ambulate. Encephalopathy. EXAM: CT HEAD WITHOUT CONTRAST TECHNIQUE: Contiguous axial images were obtained from the base of the skull through the vertex without intravenous contrast. COMPARISON:  None. FINDINGS: Brain: There is no evidence of acute intracranial hemorrhage, mass lesion, brain edema or extra-axial fluid collection. Atrophy with prominence of the ventricles and subarachnoid spaces, especially in the temporal lobes. Relatively mild chronic small vessel ischemic changes in the periventricular white matter. There is no CT evidence of acute cortical infarction. Vascular: Prominent intracranial  vascular calcifications. No hyperdense vessel identified. Skull: Negative for fracture or focal lesion. Sinuses/Orbits: The visualized paranasal sinuses and mastoid air cells are clear. No orbital abnormalities are seen. Other: None. IMPRESSION: 1.  No acute intracranial findings. 2. Moderate atrophy and mild chronic small vessel ischemic changes. Electronically Signed   By: Richardean Sale M.D.   On: 04/25/2019 20:27   DG Chest Portable 1 View  Result Date: 04/28/2019 CLINICAL DATA:  Weakness EXAM: PORTABLE CHEST 1 VIEW COMPARISON:  None. FINDINGS: Heart is upper limits normal in size. Patchy airspace opacities in the left upper lobe and peripherally in the left mid and lower lung. Right lung clear. No effusions or acute bony abnormality. IMPRESSION: Patchy predominantly peripheral airspace disease in the left lung concerning for pneumonia. Electronically Signed   By: Rolm Baptise M.D.   On: 04/30/2019 19:50         Assessment and Plan:   1.  Atrial fibrillation rapid ventricular response -Her underlying infection/sepsis worsening tachycardia -Hypotension limiting use of calcium channel blockers or beta blockers.  Amiodarone can also cause hypotension. -Our only option at this point is digoxin renally dosed at 0.0625 mg every 48 hours.  Get dig levels in the a.m. to evaluate toxicity. -Hopefully with hydration and management of infection, tachycardia will improve as blood pressure improves. -Get echocardiogram -Telemetry reading which was noted as VT is not accurate, patient has left bundle branch block  -Long-term anticoagulation is a concern as patient is anemic, altered, dependent for all activities of daily living.  I think the risks outweigh the benefits at this point.    2.  Altered mental status, dementia -Recommend work-up and management as per primary team.  Lactic acid levels, ammonia levels.  3.  Pneumonia, sepsis -On IV fluids and antibiotics as per primary team  4.   CKD -Creatinine stable.  Continue to monitor.   Signed, Kate Sable, MD  04/16/2019 3:10 PM

## 2019-04-16 NOTE — Plan of Care (Signed)
  Problem: Education: Goal: Knowledge of General Education information will improve Description: Including pain rating scale, medication(s)/side effects and non-pharmacologic comfort measures Outcome: Not Progressing   

## 2019-04-16 NOTE — Progress Notes (Addendum)
Patient transferred to rm 254 from 1C. Cardiac monitor placed, call bell within reach and bed alarm is on. Patient continuously screaming and stated that she is in pain. Patients HR in afib RVR into the 150's. This RN administered IV digoxin and pain medication. Patients status has not changed. Paged MD, awaiting response.

## 2019-04-16 NOTE — Evaluation (Addendum)
Physical Therapy Evaluation Patient Details Name: Katrina Chambers MRN: [id number] DOB: 09/23/1934 Today's Date: 04/16/2019   History of Present Illness  Patient is an 85 year old female admitted with general weakness and increased confusion. Found to have CAP. PMH includes: DM, CKD  Clinical Impression  Patient received in bed, lethargic, opens eyes and can tell me her name. Agrees to Try to sit edge of bed. Upon sitting edge of bed, patient became extreme tachy, with HR up to 190 briefly, otherwise jumping from 110s to 150s. Patient returned to supine. She required max +2 assist for bed mobility and unable to sit unsupported. Patient will continue to benefit from skilled PT while here to improve strength, functional independence and safety.         Follow Up Recommendations SNF    Equipment Recommendations  None recommended by PT;Other (comment)(TBD)    Recommendations for Other Services       Precautions / Restrictions Precautions Precautions: Fall Restrictions Weight Bearing Restrictions: No      Mobility  Bed Mobility Overal bed mobility: Needs Assistance Bed Mobility: Supine to Sit;Sit to Supine     Supine to sit: Max assist;+2 for physical assistance Sit to supine: Max assist;+2 for physical assistance   General bed mobility comments: upon sitting edge of bed patient's heart rate became very tachy. Jumping from 90-190. Low BP as well.  Transfers                 General transfer comment: unable to attempt due to HR and level of awareness  Ambulation/Gait             General Gait Details: unable  Stairs            Wheelchair Mobility    Modified Rankin (Stroke Patients Only)       Balance Overall balance assessment: Needs assistance   Sitting balance-Leahy Scale: Zero Sitting balance - Comments: requires total assist with sitting balance                                     Pertinent Vitals/Pain      Home Living  Family/patient expects to be discharged to:: Skilled nursing facility                 Additional Comments: unsure of home situation. Patient unable to tell me.    Prior Function           Comments: unsure     Hand Dominance        Extremity/Trunk Assessment   Upper Extremity Assessment Upper Extremity Assessment: Generalized weakness    Lower Extremity Assessment Lower Extremity Assessment: Generalized weakness    Cervical / Trunk Assessment Cervical / Trunk Assessment: Normal  Communication   Communication: No difficulties  Cognition Arousal/Alertness: Awake/alert Behavior During Therapy: WFL for tasks assessed/performed Overall Cognitive Status: Within Functional Limits for tasks assessed                                        General Comments      Exercises     Assessment/Plan    PT Assessment Patient needs continued PT services  PT Problem List Decreased strength;Decreased activity tolerance;Decreased balance;Decreased cognition;Decreased mobility;Decreased safety awareness       PT Treatment Interventions Gait training;Functional mobility training;Therapeutic activities;Patient/family  education;Therapeutic exercise    PT Goals (Current goals can be found in the Care Plan section)  Acute Rehab PT Goals Patient Stated Goal: none stated PT Goal Formulation: Patient unable to participate in goal setting Time For Goal Achievement: 04/30/19    Frequency Min 2X/week   Barriers to discharge   unsure of home situation and patient is unable to provide information regarding this.    Co-evaluation               AM-PAC PT "6 Clicks" Mobility  Outcome Measure Help needed turning from your back to your side while in a flat bed without using bedrails?: A Lot Help needed moving from lying on your back to sitting on the side of a flat bed without using bedrails?: A Lot Help needed moving to and from a bed to a chair (including a  wheelchair)?: Total Help needed standing up from a chair using your arms (e.g., wheelchair or bedside chair)?: Total Help needed to walk in hospital room?: Total Help needed climbing 3-5 steps with a railing? : Total 6 Click Score: 8    End of Session   Activity Tolerance: Patient limited by fatigue;Treatment limited secondary to medical complications (Comment);Other (comment)(Tachycardia) Patient left: in bed;with bed alarm set Nurse Communication: Mobility status PT Visit Diagnosis: Muscle weakness (generalized) (M62.81);Other abnormalities of gait and mobility (R26.89);Difficulty in walking, not elsewhere classified (R26.2)    Time: 4854-6270 PT Time Calculation (min) (ACUTE ONLY): 17 min   Charges:   PT Evaluation $PT Eval Moderate Complexity: 1 Mod PT Treatments $Therapeutic Activity: 8-22 mins        Camani Sesay, PT, GCS 04/16/19,1:44 PM

## 2019-04-16 NOTE — Progress Notes (Signed)
PROGRESS NOTE    Katrina Chambers  SEG:[id number] DOB: 19-Jan-1934 DOA: 04/07/2019 PCP: [facility]   Brief Narrative:  Katrina Chambers  is a 85 y.o. African-American female with a known history of type 2 diabetes mellitus and chronic kidney disease, who presented to the emergency room with acute onset of generalized weakness and increasing confusion.  She was not able to ambulate from generalized weakness today.  The patient did not have any reported fever or chills, nausea or vomiting.  Talked with son and for the past few weeks she was going down the hill, unable to eat, require help with all her ADLs.  She does not recognize where she is and her loved ones.  She has a long standing diagnosis of dementia which is worsening now.  We also discussed about her CODE STATUS and made her DNR as patient never wants chest compressions or intubation according to her son.  Continue to have softer blood pressure.  Chest x-ray with some patchy infiltrate and she was started on antibiotics for pneumonia.  Subjective: Patient mostly remained nonverbal, did noted head to no once when asked about chest pain.  She appears very lethargic. Nursing concern about softer blood pressure and tachycardic arrhythmias on telemetry.  Assessment & Plan:   Active Problems:   CAP (community acquired pneumonia)  Community-acquired left-sided pneumonia. Patient remained afebrile with no leukocytosis. Saturating well on room air. -We will check procalcitonin. -Continue ceftriaxone and azithromycin for now.  Thachy arrhythmias with persistent hypotension. Repeat EKG with A. fib and RVR, left axis deviation and left bundle branch block.  No prior diagnosis of any arrhythmia but she has similar changes in an EKG done in 2005. Later some telemetry concern of going in and out of V. Tach. -Try 500 cc bolus as needed. -Cardiology consult. -Echocardiogram -Move her to progressive care due to persistent hypotension  despite boluses.  Diabetes mellitus with hyperglycemia.  A1c done in January 21 was 6.9. -Continue with basal and NovoLog with SSI.  Hypertension.  Patient on multiple antihypertensives at home which are being held due to softer blood pressure.  History of advanced dementia and failure to thrive.  Patient is now completely dependent on all of her ADLs.  Lives with her son.  Very poor p.o. intake for the past few weeks, progressively becoming more lethargic and weak. -PT/OT evaluation-recommending SNF placement. -Patient is very high risk for deterioration and death-discussed with son and he understands.  Chronic kidney disease possibly stage IIIb -IV.  Creatinine appears to be close to her baseline. -Monitor renal function. -Continue Renvela. -Start her on sodium bicarbonate due to metabolic acidosis secondary to CKD. -Avoid nephrotoxins.  Dyslipidemia. -Continue home dose of statin.  Objective: Vitals:   04/26/2019 2344 04/16/19 0754 04/16/19 1100 04/16/19 1200  BP: (!) 101/45 (!) 102/42  (!) 96/35  Pulse: 92 91 (!) 141 (!) 59  Resp: 20 16 17 17   Temp: 97.6 F (36.4 C) 97.6 F (36.4 C)    TempSrc:  Oral    SpO2: 100% 98% 98% 97%  Weight:      Height:        Intake/Output Summary (Last 24 hours) at 04/16/2019 1328 Last data filed at 04/16/2019 0514 Gross per 24 hour  Intake 0 ml  Output --  Net 0 ml   Filed Weights   04/08/2019 1821  Weight: 84.5 kg    Examination:  General exam: Chronically ill-appearing, lethargic elderly lady, in no acute distress. Respiratory system: Clear  to auscultation. Respiratory effort normal. Cardiovascular system: Irregularly irregular with tachycardia, no JVD, murmurs, rubs, gallops or clicks. Gastrointestinal system: Soft, nontender, nondistended, bowel sounds positive. Central nervous system: Lethargic, mostly nonverbal and not following much commands. Extremities: No edema, no cyanosis, pulses intact and symmetrical. Psychiatry:  Judgement and insight appear grossly impaired.   DVT prophylaxis: Lovenox Code Status: DNR Family Communication: Discussed in detail with her son who is is also her POA. Changed her status to DNR according to her wishes after discussing with her son. Disposition Plan:  Status is: Inpatient  Dispo: The patient is from: Home              Anticipated d/c is to: SNF              Anticipated d/c date is: 3 days              Patient currently is not medically stable to d/c.  Patient with advanced dementia, and failure to thrive.  Very lethargic and refusing p.o. intake.  High risk for deterioration and death. We will call palliative after the weekend.  Consultants:   Cardiology  Palliative care  Procedures:  Antimicrobials:  Rocephin Azithromycin  Data Reviewed: I have personally reviewed following labs and imaging studies  CBC: Recent Labs  Lab 04/13/2019 1849 04/16/19 0553  WBC 8.3 8.3  HGB 8.7* 8.4*  HCT 29.4* 27.6*  MCV 98.3 95.8  PLT 138* 237*   Basic Metabolic Panel: Recent Labs  Lab 04/23/2019 1849 04/16/19 0006 04/16/19 0553  NA 139  --  142  K 4.3  --  4.2  CL 113*  --  116*  CO2 16*  --  16*  GLUCOSE 321*  --  310*  BUN 64*  --  61*  CREATININE 2.63*  --  2.66*  CALCIUM 8.3*  --  8.3*  MG  --  1.8  --   PHOS  --  4.8*  --    GFR: Estimated Creatinine Clearance: 17.6 mL/min (A) (by C-G formula based on SCr of 2.66 mg/dL (H)). Liver Function Tests: No results for input(s): AST, ALT, ALKPHOS, BILITOT, PROT, ALBUMIN in the last 168 hours. No results for input(s): LIPASE, AMYLASE in the last 168 hours. No results for input(s): AMMONIA in the last 168 hours. Coagulation Profile: No results for input(s): INR, PROTIME in the last 168 hours. Cardiac Enzymes: No results for input(s): CKTOTAL, CKMB, CKMBINDEX, TROPONINI in the last 168 hours. BNP (last 3 results) No results for input(s): PROBNP in the last 8760 hours. HbA1C: No results for input(s): HGBA1C  in the last 72 hours. CBG: No results for input(s): GLUCAP in the last 168 hours. Lipid Profile: No results for input(s): CHOL, HDL, LDLCALC, TRIG, CHOLHDL, LDLDIRECT in the last 72 hours. Thyroid Function Tests: No results for input(s): TSH, T4TOTAL, FREET4, T3FREE, THYROIDAB in the last 72 hours. Anemia Panel: No results for input(s): VITAMINB12, FOLATE, FERRITIN, TIBC, IRON, RETICCTPCT in the last 72 hours. Sepsis Labs: No results for input(s): PROCALCITON, LATICACIDVEN in the last 168 hours.  Recent Results (from the past 240 hour(s))  Blood culture (routine x 2)     Status: None (Preliminary result)   Collection Time: 05/04/2019  8:40 PM   Specimen: BLOOD  Result Value Ref Range Status   Specimen Description BLOOD RIGHT ANTECUBITAL  Final   Special Requests   Final    BOTTLES DRAWN AEROBIC AND ANAEROBIC Blood Culture adequate volume   Culture   Final  NO GROWTH < 12 HOURS Performed at [facility], [address] [place], [place] [address]    Report Status PENDING  Incomplete  Blood culture (routine x 2)     Status: None (Preliminary result)   Collection Time: 05/02/2019  8:40 PM   Specimen: BLOOD  Result Value Ref Range Status   Specimen Description BLOOD LEFT ANTECUBITAL  Final   Special Requests   Final    BOTTLES DRAWN AEROBIC AND ANAEROBIC Blood Culture adequate volume   Culture   Final    NO GROWTH < 12 HOURS Performed at [facility], [address], [place], [place] [address]    Report Status PENDING  Incomplete  Respiratory Panel by RT PCR (Flu A&B, Covid) - Nasopharyngeal Swab     Status: None   Collection Time: 04/19/2019  8:40 PM   Specimen: Nasopharyngeal Swab  Result Value Ref Range Status   SARS Coronavirus 2 by RT PCR NEGATIVE NEGATIVE Final    Comment: (NOTE) SARS-CoV-2 target nucleic acids are NOT DETECTED. The SARS-CoV-2 RNA is generally detectable in upper respiratoy specimens during the acute phase of infection. The  lowest concentration of SARS-CoV-2 viral copies this assay can detect is 131 copies/mL. A negative result does not preclude SARS-Cov-2 infection and should not be used as the sole basis for treatment or other patient management decisions. A negative result may occur with  improper specimen collection/handling, submission of specimen other than nasopharyngeal swab, presence of viral mutation(s) within the areas targeted by this assay, and inadequate number of viral copies (<131 copies/mL). A negative result must be combined with clinical observations, patient history, and epidemiological information. The expected result is Negative. Fact Sheet for Patients:  PinkCheek.be Fact Sheet for Healthcare Providers:  GravelBags.it This test is not yet ap proved or cleared by the [place] FDA and  has been authorized for detection and/or diagnosis of SARS-CoV-2 by FDA under an Emergency Use Authorization (EUA). This EUA will remain  in effect (meaning this test can be used) for the duration of the COVID-19 declaration under Section 564(b)(1) of the Act, 21 U.S.C. section 360bbb-3(b)(1), unless the authorization is terminated or revoked sooner.    Influenza A by PCR NEGATIVE NEGATIVE Final   Influenza B by PCR NEGATIVE NEGATIVE Final    Comment: (NOTE) The Xpert Xpress SARS-CoV-2/FLU/RSV assay is intended as an aid in  the diagnosis of influenza from Nasopharyngeal swab specimens and  should not be used as a sole basis for treatment. Nasal washings and  aspirates are unacceptable for Xpert Xpress SARS-CoV-2/FLU/RSV  testing. Fact Sheet for Patients: PinkCheek.be Fact Sheet for Healthcare Providers: GravelBags.it This test is not yet approved or cleared by the [place] FDA and  has been authorized for detection and/or diagnosis of SARS-CoV-2 by  FDA under an Emergency  Use Authorization (EUA). This EUA will remain  in effect (meaning this test can be used) for the duration of the  Covid-19 declaration under Section 564(b)(1) of the Act, 21  U.S.C. section 360bbb-3(b)(1), unless the authorization is  terminated or revoked. Performed at [facility], [address] [place],  [address]      Radiology Studies: CT Head Wo Contrast  Result Date: 05/01/2019 CLINICAL DATA:  New onset of weakness and incontinence. Unable to ambulate. Encephalopathy. EXAM: CT HEAD WITHOUT CONTRAST TECHNIQUE: Contiguous axial images were obtained from the base of the skull through the vertex without intravenous contrast. COMPARISON:  None. FINDINGS: Brain: There is no evidence of  acute intracranial hemorrhage, mass lesion, brain edema or extra-axial fluid collection. Atrophy with prominence of the ventricles and subarachnoid spaces, especially in the temporal lobes. Relatively mild chronic small vessel ischemic changes in the periventricular white matter. There is no CT evidence of acute cortical infarction. Vascular: Prominent intracranial vascular calcifications. No hyperdense vessel identified. Skull: Negative for fracture or focal lesion. Sinuses/Orbits: The visualized paranasal sinuses and mastoid air cells are clear. No orbital abnormalities are seen. Other: None. IMPRESSION: 1. No acute intracranial findings. 2. Moderate atrophy and mild chronic small vessel ischemic changes. Electronically Signed   By: Richardean Sale M.D.   On: 04/23/2019 20:27   DG Chest Portable 1 View  Result Date: 04/19/2019 CLINICAL DATA:  Weakness EXAM: PORTABLE CHEST 1 VIEW COMPARISON:  None. FINDINGS: Heart is upper limits normal in size. Patchy airspace opacities in the left upper lobe and peripherally in the left mid and lower lung. Right lung clear. No effusions or acute bony abnormality. IMPRESSION: Patchy predominantly peripheral airspace disease in the left lung concerning for  pneumonia. Electronically Signed   By: Rolm Baptise M.D.   On: 04/08/2019 19:50    Scheduled Meds: . aspirin EC  81 mg Oral Daily  . guaiFENesin  600 mg Oral BID  . heparin injection (subcutaneous)  5,000 Units Subcutaneous Q8H  . insulin glargine  10 Units Subcutaneous QHS  . ipratropium-albuterol  3 mL Nebulization QID  . sevelamer carbonate  800 mg Oral TID WC  . simvastatin  20 mg Oral QHS  . sodium bicarbonate  650 mg Oral TID   Continuous Infusions: . sodium chloride 100 mL/hr at 04/16/19 0514  . azithromycin    . cefTRIAXone (ROCEPHIN)  IV       LOS: 1 day   Time spent: 45 minutes.  Lorella Nimrod, MD [facility]  If 7PM-7AM, please contact night-coverage [email or web address]  04/16/2019, 1:28 PM   This record has been created using Systems analyst. Errors have been sought and corrected,but may not always be located. Such creation errors do not reflect on the standard of care.

## 2019-04-17 ENCOUNTER — Inpatient Hospital Stay: Payer: Medicare Other

## 2019-04-17 ENCOUNTER — Inpatient Hospital Stay (HOSPITAL_COMMUNITY)
Admit: 2019-04-17 | Discharge: 2019-04-17 | Disposition: A | Discharge status: Home / Self Care | Payer: Medicare Other | Attending: Internal Medicine | Admitting: Internal Medicine

## 2019-04-17 DIAGNOSIS — I361 Nonrheumatic tricuspid (valve) insufficiency: Secondary | ICD-10-CM

## 2019-04-17 LAB — BASIC METABOLIC PANEL
Anion gap: 13 (ref 5–15)
BUN: 66 mg/dL — ABNORMAL HIGH (ref 8–23)
CO2: 13 mmol/L — ABNORMAL LOW (ref 22–32)
Calcium: 8 mg/dL — ABNORMAL LOW (ref 8.9–10.3)
Chloride: 119 mmol/L — ABNORMAL HIGH (ref 98–111)
Creatinine, Ser: 3.03 mg/dL — ABNORMAL HIGH (ref 0.44–1.00)
GFR calc Af Amer: 16 mL/min — ABNORMAL LOW (ref >60)
GFR calc non Af Amer: 14 mL/min — ABNORMAL LOW (ref >60)
Glucose, Bld: 424 mg/dL — ABNORMAL HIGH (ref 70–99)
Potassium: 4.5 mmol/L (ref 3.5–5.1)
Sodium: 145 mmol/L (ref 135–145)

## 2019-04-17 LAB — PROCALCITONIN: Procalcitonin: <0.1 ng/mL

## 2019-04-17 LAB — CBC
HCT: 31.2 % — ABNORMAL LOW (ref 36.0–46.0)
Hemoglobin: 9 g/dL — ABNORMAL LOW (ref 12.0–15.0)
MCH: 29.3 pg (ref 26.0–34.0)
MCHC: 28.8 g/dL — ABNORMAL LOW (ref 30.0–36.0)
MCV: 101.6 fL — ABNORMAL HIGH (ref 80.0–100.0)
Platelets: 142 10*3/uL — ABNORMAL LOW (ref 150–400)
RBC: 3.07 MIL/uL — ABNORMAL LOW (ref 3.87–5.11)
RDW: 14.3 % (ref 11.5–15.5)
WBC: 9 10*3/uL (ref 4.0–10.5)
nRBC: 0.6 % — ABNORMAL HIGH (ref 0.0–0.2)

## 2019-04-17 LAB — ECHOCARDIOGRAM COMPLETE
Height: 66 in
Weight: 2705.6 oz

## 2019-04-17 LAB — GLUCOSE, CAPILLARY
Glucose-Capillary: 372 mg/dL — ABNORMAL HIGH (ref 70–99)
Glucose-Capillary: 338 mg/dL — ABNORMAL HIGH (ref 70–99)

## 2019-04-17 LAB — DIGOXIN LEVEL: Digoxin Level: 0.6 ng/mL — ABNORMAL LOW (ref 0.8–2.0)

## 2019-04-17 MED ORDER — INSULIN ASPART 100 UNIT/ML ~~LOC~~ SOLN
0.0000 [IU] | Freq: Every day | SUBCUTANEOUS | Status: DC
  Administered 2019-04-17: 4 [IU] via SUBCUTANEOUS
  Filled 2019-04-17: qty 1

## 2019-04-17 MED ORDER — DIPHENHYDRAMINE HCL 12.5 MG/5ML PO ELIX
12.5000 mg | ORAL_SOLUTION | Freq: Once | ORAL | Status: DC
  Filled 2019-04-17: qty 5

## 2019-04-17 MED ORDER — METOPROLOL TARTRATE 5 MG/5ML IV SOLN
5.0000 mg | Freq: Four times a day (QID) | INTRAVENOUS | Status: DC
  Administered 2019-04-17 – 2019-04-18 (×3): 5 mg via INTRAVENOUS
  Filled 2019-04-17 (×3): qty 5

## 2019-04-17 MED ORDER — FUROSEMIDE 10 MG/ML IJ SOLN
40.0000 mg | Freq: Once | INTRAMUSCULAR | Status: AC
  Administered 2019-04-17: 40 mg via INTRAVENOUS
  Filled 2019-04-17: qty 4

## 2019-04-17 MED ORDER — SODIUM BICARBONATE 650 MG PO TABS
1300.0000 mg | ORAL_TABLET | Freq: Three times a day (TID) | ORAL | Status: DC
  Administered 2019-04-17: 1300 mg via ORAL
  Filled 2019-04-17: qty 2

## 2019-04-17 MED ORDER — INSULIN ASPART 100 UNIT/ML ~~LOC~~ SOLN
0.0000 [IU] | Freq: Three times a day (TID) | SUBCUTANEOUS | Status: DC
  Administered 2019-04-17: 9 [IU] via SUBCUTANEOUS
  Filled 2019-04-17: qty 1

## 2019-04-17 MED ORDER — DIPHENHYDRAMINE HCL 50 MG/ML IJ SOLN
12.5000 mg | Freq: Once | INTRAMUSCULAR | Status: AC
  Administered 2019-04-17: 12.5 mg via INTRAVENOUS
  Filled 2019-04-17: qty 1

## 2019-04-17 NOTE — Progress Notes (Signed)
Progress Note  Patient Name: Katrina Chambers Date of Encounter: 04/17/2019  Primary Cardiologist: New to [place] rounding  Subjective   Patient still tachycardic.  Digoxin levels okay.  Blood pressure improved compared to yesterday.  Inpatient Medications    Scheduled Meds: . aspirin EC  81 mg Oral Daily  . digoxin  0.0625 mg Intravenous Q48H  . diphenhydrAMINE  12.5 mg Oral Once  . guaiFENesin  600 mg Oral BID  . heparin injection (subcutaneous)  5,000 Units Subcutaneous Q8H  . insulin glargine  10 Units Subcutaneous QHS  . metoprolol tartrate  5 mg Intravenous Q6H  . sevelamer carbonate  800 mg Oral TID WC  . simvastatin  20 mg Oral QHS  . sodium bicarbonate  1,300 mg Oral TID   Continuous Infusions: . azithromycin 500 mg (04/16/19 2042)  . cefTRIAXone (ROCEPHIN)  IV 2 g (04/16/19 2147)   PRN Meds: acetaminophen **OR** acetaminophen, ipratropium-albuterol, ondansetron **OR** ondansetron (ZOFRAN) IV, traZODone   Vital Signs    Vitals:   04/17/19 1023 04/17/19 1025 04/17/19 1030 04/17/19 1118  BP:  (!) 132/43  (!) 147/49  Pulse:    (!) 105  Resp: (!) 21 (!) 35 19 20  Temp:    97.9 F (36.6 C)  TempSrc:      SpO2:    94%  Weight:      Height:        Intake/Output Summary (Last 24 hours) at 04/17/2019 1122 Last data filed at 04/16/2019 1711 Gross per 24 hour  Intake 3657.19 ml  Output --  Net 3657.19 ml   Last 3 Weights 04/17/2019 04/16/2019 04/30/2019  Weight (lbs) 169 lb 1.6 oz 166 lb 10.7 oz 186 lb 4.6 oz  Weight (kg) 76.703 kg 75.6 kg 84.5 kg      Telemetry    Atrial fibrillation, bundle branch block- Personally Reviewed  ECG    New tracing obtained- Personally Reviewed  Physical Exam   GEN:  Incoherent sounds, somnolent.   Neck: No JVD Cardiac:  Irregular, tachycardic, .  Respiratory:  Coarse breath sounds, poor inspiratory effort GI: Soft, nontender, non-distended  MS: No edema; No deformity. Neuro:   Unable to assess Psych:  Unable to assess  Labs    High Sensitivity Troponin:   Recent Labs  Lab 04/16/19 0909 04/16/19 1115  TROPONINIHS 17 20*      Chemistry Recent Labs  Lab 05/04/2019 1849 04/16/19 0553 04/17/19 0606  NA 139 142 145  K 4.3 4.2 4.5  CL 113* 116* 119*  CO2 16* 16* 13*  GLUCOSE 321* 310* 424*  BUN 64* 61* 66*  CREATININE 2.63* 2.66* 3.03*  CALCIUM 8.3* 8.3* 8.0*  GFRNONAA 16* 16* 14*  GFRAA 19* 18* 16*  ANIONGAP 10 10 13      Hematology Recent Labs  Lab 04/21/2019 1849 04/16/19 0553 04/17/19 0606  WBC 8.3 8.3 9.0  RBC 2.99* 2.88* 3.07*  HGB 8.7* 8.4* 9.0*  HCT 29.4* 27.6* 31.2*  MCV 98.3 95.8 101.6*  MCH 29.1 29.2 29.3  MCHC 29.6* 30.4 28.8*  RDW 13.7 13.9 14.3  PLT 138* 132* 142*    BNPNo results for input(s): BNP, PROBNP in the last 168 hours.   DDimer No results for input(s): DDIMER in the last 168 hours.   Radiology    CT Head Wo Contrast  Result Date: 05/02/2019 CLINICAL DATA:  New onset of weakness and incontinence. Unable to ambulate. Encephalopathy. EXAM: CT HEAD WITHOUT CONTRAST TECHNIQUE: Contiguous axial images were obtained  from the base of the skull through the vertex without intravenous contrast. COMPARISON:  None. FINDINGS: Brain: There is no evidence of acute intracranial hemorrhage, mass lesion, brain edema or extra-axial fluid collection. Atrophy with prominence of the ventricles and subarachnoid spaces, especially in the temporal lobes. Relatively mild chronic small vessel ischemic changes in the periventricular white matter. There is no CT evidence of acute cortical infarction. Vascular: Prominent intracranial vascular calcifications. No hyperdense vessel identified. Skull: Negative for fracture or focal lesion. Sinuses/Orbits: The visualized paranasal sinuses and mastoid air cells are clear. No orbital abnormalities are seen. Other: None. IMPRESSION: 1. No acute intracranial findings. 2. Moderate atrophy and mild chronic small vessel ischemic changes.  Electronically Signed   By: Richardean Sale M.D.   On: 04/13/2019 20:27   DG Chest Portable 1 View  Result Date: 05/04/2019 CLINICAL DATA:  Weakness EXAM: PORTABLE CHEST 1 VIEW COMPARISON:  None. FINDINGS: Heart is upper limits normal in size. Patchy airspace opacities in the left upper lobe and peripherally in the left mid and lower lung. Right lung clear. No effusions or acute bony abnormality. IMPRESSION: Patchy predominantly peripheral airspace disease in the left lung concerning for pneumonia. Electronically Signed   By: Rolm Baptise M.D.   On: 04/23/2019 19:50    Cardiac Studies   Echocardiogram is pending  Patient Profile     85 y.o. female with history of diabetes, CKD, hypertension, hyperlipidemia being seen for atrial fibrillation with rapid ventricular response  Assessment & Plan    1.  Atrial fibrillation with rapid ventricular response -Patient still tachycardic, blood pressures improve after IV fluids -Continue digoxin 0.0625 mg every 48 hours -Start IV Lopressor 5 mg every 6 hours with hold parameters for heart rate less than [id number] and or systolic blood pressure less than 100 mmHg -Patient not a good candidate for long-term anticoagulation as previously mentioned. -Echo  2.  Altered mental status, dementia -Management and work-up as per primary team  3.  Pneumonia and sepsis -On antibiotics  4.  CKD -Creatinine is worse today -Consider renal input      Signed, Kate Sable, MD  04/17/2019, 11:22 AM

## 2019-04-17 NOTE — Progress Notes (Signed)
PROGRESS NOTE    Katrina Chambers  YPP:[id number] DOB: 1934-06-27 DOA: 04/10/2019 PCP: [facility]   Brief Narrative:  Katrina Chambers  is a 85 y.o. African-American female with a known history of type 2 diabetes mellitus and chronic kidney disease, who presented to the emergency room with acute onset of generalized weakness and increasing confusion.  She was not able to ambulate from generalized weakness today.  The patient did not have any reported fever or chills, nausea or vomiting.  Talked with son and for the past few weeks she was going down the hill, unable to eat, require help with all her ADLs.  She does not recognize where she is and her loved ones.  She has a long standing diagnosis of dementia which is worsening now.  We also discussed about her CODE STATUS and made her DNR as patient never wants chest compressions or intubation according to her son.  Continue to have softer blood pressure.  Chest x-ray with some patchy infiltrate and she was started on antibiotics for pneumonia.  Subjective: Patient mostly remained nonverbal, and moans most of the time.  Not responding to any questions or commands.  Assessment & Plan:   Active Problems:   CKD (chronic kidney disease), stage IV (HCC)   CAP (community acquired pneumonia)   Weakness   Atrial fibrillation with RVR (HCC)   Dementia without behavioral disturbance (HCC)  Community-acquired left-sided pneumonia. Patient remained afebrile with no leukocytosis. Saturating well on room air. -Calcitonin remain negative. -Continue ceftriaxone and azithromycin for now.  Thachy arrhythmias with persistent hypotension. Repeat EKG with A. fib and RVR, left axis deviation and left bundle branch block.  No prior diagnosis of any arrhythmia but she has similar changes in an EKG done in 2005. -Cardiology consult. -Echocardiogram-EF of 40 to 45% with global hypokinesis. -Blood pressure improved today. -Cardiology added metoprolol  with parameters. -Not a Candidate for anticoagulation.  HFrEF.  Patient with EF of 40 to 45%.  She was little tachypneic with bibasilar crackles this morning.  Did receive fluids yesterday due to persistent hypotension. Chest x-ray with pulmonary vascular congestion. -Give her 1 time dose of Lasix 40 mg IV and monitor.  Diabetes mellitus with hyperglycemia.  A1c done in January 21 was 6.9. -Continue with basal and NovoLog with SSI.  Hypertension.  Patient on multiple antihypertensives at home which are being held due to softer blood pressure.  History of advanced dementia and failure to thrive.  Patient is now completely dependent on all of her ADLs.  Lives with her son.  Very poor p.o. intake for the past few weeks, progressively becoming more lethargic and weak. -PT/OT evaluation-recommending SNF placement. -Patient is very high risk for deterioration and death-discussed with son and he understands.  Chronic kidney disease possibly stage IIIb -IV/metabolic acidosis.  creatinine worsening today, at 3.03.  Bicarb of 13 -Monitor renal function. -Continue Renvela. -Increased dose of sodium bicarbonate  -Avoid nephrotoxins.  Dyslipidemia. -Continue home dose of statin.  Objective: Vitals:   04/17/19 1023 04/17/19 1025 04/17/19 1030 04/17/19 1118  BP:  (!) 132/43  (!) 147/49  Pulse:    (!) 105  Resp: (!) 21 (!) 35 19 20  Temp:    97.9 F (36.6 C)  TempSrc:      SpO2:    94%  Weight:      Height:        Intake/Output Summary (Last 24 hours) at 04/17/2019 1543 Last data filed at 04/16/2019 1711 Gross per  24 hour  Intake 3417.19 ml  Output --  Net 3417.19 ml   Filed Weights   04/22/2019 1821 04/16/19 1700 04/17/19 0620  Weight: 84.5 kg 75.6 kg 76.7 kg    Examination:  General exam: Chronically ill-appearing, lethargic elderly lady, in no acute distress. Respiratory system: Bilateral basal crackles. respiratory effort normal. Cardiovascular system: Irregularly irregular  with tachycardia, no JVD, murmurs, rubs, gallops or clicks. Gastrointestinal system: Soft, nontender, nondistended, bowel sounds positive. Central nervous system: Lethargic, mostly nonverbal and not following much commands. Extremities: No edema, no cyanosis, pulses intact and symmetrical. Psychiatry: Judgement and insight appear grossly impaired.   DVT prophylaxis: Lovenox Code Status: DNR Family Communication: Son was updated on phone. Disposition Plan:  Status is: Inpatient  Dispo: The patient is from: Home              Anticipated d/c is to: SNF              Anticipated d/c date is: 3 days              Patient currently is not medically stable to d/c.  Patient with advanced dementia, and failure to thrive.  Very lethargic and refusing p.o. intake.  High risk for deterioration and death. We will call palliative after the weekend.  Consultants:   Cardiology  Palliative care  Procedures:  Antimicrobials:  Rocephin Azithromycin  Data Reviewed: I have personally reviewed following labs and imaging studies  CBC: Recent Labs  Lab 04/16/2019 1849 04/16/19 0553 04/17/19 0606  WBC 8.3 8.3 9.0  HGB 8.7* 8.4* 9.0*  HCT 29.4* 27.6* 31.2*  MCV 98.3 95.8 101.6*  PLT 138* 132* 662*   Basic Metabolic Panel: Recent Labs  Lab 05/04/2019 1849 04/16/19 0006 04/16/19 0553 04/17/19 0606  NA 139  --  142 145  K 4.3  --  4.2 4.5  CL 113*  --  116* 119*  CO2 16*  --  16* 13*  GLUCOSE 321*  --  310* 424*  BUN 64*  --  61* 66*  CREATININE 2.63*  --  2.66* 3.03*  CALCIUM 8.3*  --  8.3* 8.0*  MG  --  1.8  --   --   PHOS  --  4.8*  --   --    GFR: Estimated Creatinine Clearance: 14.7 mL/min (A) (by C-G formula based on SCr of 3.03 mg/dL (H)). Liver Function Tests: No results for input(s): AST, ALT, ALKPHOS, BILITOT, PROT, ALBUMIN in the last 168 hours. No results for input(s): LIPASE, AMYLASE in the last 168 hours. No results for input(s): AMMONIA in the last 168  hours. Coagulation Profile: No results for input(s): INR, PROTIME in the last 168 hours. Cardiac Enzymes: No results for input(s): CKTOTAL, CKMB, CKMBINDEX, TROPONINI in the last 168 hours. BNP (last 3 results) No results for input(s): PROBNP in the last 8760 hours. HbA1C: No results for input(s): HGBA1C in the last 72 hours. CBG: Recent Labs  Lab 04/16/19 2154  GLUCAP 350*   Lipid Profile: No results for input(s): CHOL, HDL, LDLCALC, TRIG, CHOLHDL, LDLDIRECT in the last 72 hours. Thyroid Function Tests: No results for input(s): TSH, T4TOTAL, FREET4, T3FREE, THYROIDAB in the last 72 hours. Anemia Panel: No results for input(s): VITAMINB12, FOLATE, FERRITIN, TIBC, IRON, RETICCTPCT in the last 72 hours. Sepsis Labs: Recent Labs  Lab 04/16/19 0006 04/17/19 0606  PROCALCITON <0.10 <0.10    Recent Results (from the past 240 hour(s))  Blood culture (routine x 2)  Status: None (Preliminary result)   Collection Time: 04/09/2019  8:40 PM   Specimen: BLOOD  Result Value Ref Range Status   Specimen Description BLOOD RIGHT ANTECUBITAL  Final   Special Requests   Final    BOTTLES DRAWN AEROBIC AND ANAEROBIC Blood Culture adequate volume   Culture   Final    NO GROWTH 2 DAYS Performed at [facility], [address] [place], [place] [address]    Report Status PENDING  Incomplete  Blood culture (routine x 2)     Status: None (Preliminary result)   Collection Time: 04/14/2019  8:40 PM   Specimen: BLOOD  Result Value Ref Range Status   Specimen Description BLOOD LEFT ANTECUBITAL  Final   Special Requests   Final    BOTTLES DRAWN AEROBIC AND ANAEROBIC Blood Culture adequate volume   Culture   Final    NO GROWTH 2 DAYS Performed at [facility], [address] [place], [place] [address]    Report Status PENDING  Incomplete  Respiratory Panel by RT PCR (Flu A&B, Covid) - Nasopharyngeal Swab     Status: None   Collection Time: 05/04/2019  8:40 PM   Specimen:  Nasopharyngeal Swab  Result Value Ref Range Status   SARS Coronavirus 2 by RT PCR NEGATIVE NEGATIVE Final    Comment: (NOTE) SARS-CoV-2 target nucleic acids are NOT DETECTED. The SARS-CoV-2 RNA is generally detectable in upper respiratoy specimens during the acute phase of infection. The lowest concentration of SARS-CoV-2 viral copies this assay can detect is 131 copies/mL. A negative result does not preclude SARS-Cov-2 infection and should not be used as the sole basis for treatment or other patient management decisions. A negative result may occur with  improper specimen collection/handling, submission of specimen other than nasopharyngeal swab, presence of viral mutation(s) within the areas targeted by this assay, and inadequate number of viral copies (<131 copies/mL). A negative result must be combined with clinical observations, patient history, and epidemiological information. The expected result is Negative. Fact Sheet for Patients:  PinkCheek.be Fact Sheet for Healthcare Providers:  GravelBags.it This test is not yet ap proved or cleared by the [place] FDA and  has been authorized for detection and/or diagnosis of SARS-CoV-2 by FDA under an Emergency Use Authorization (EUA). This EUA will remain  in effect (meaning this test can be used) for the duration of the COVID-19 declaration under Section 564(b)(1) of the Act, 21 U.S.C. section 360bbb-3(b)(1), unless the authorization is terminated or revoked sooner.    Influenza A by PCR NEGATIVE NEGATIVE Final   Influenza B by PCR NEGATIVE NEGATIVE Final    Comment: (NOTE) The Xpert Xpress SARS-CoV-2/FLU/RSV assay is intended as an aid in  the diagnosis of influenza from Nasopharyngeal swab specimens and  should not be used as a sole basis for treatment. Nasal washings and  aspirates are unacceptable for Xpert Xpress SARS-CoV-2/FLU/RSV  testing. Fact Sheet for  Patients: PinkCheek.be Fact Sheet for Healthcare Providers: GravelBags.it This test is not yet approved or cleared by the [place] FDA and  has been authorized for detection and/or diagnosis of SARS-CoV-2 by  FDA under an Emergency Use Authorization (EUA). This EUA will remain  in effect (meaning this test can be used) for the duration of the  Covid-19 declaration under Section 564(b)(1) of the Act, 21  U.S.C. section 360bbb-3(b)(1), unless the authorization is  terminated or revoked. Performed at [facility], [address] [place], [place] [address]  Radiology Studies: CT Head Wo Contrast  Result Date: 04/30/2019 CLINICAL DATA:  New onset of weakness and incontinence. Unable to ambulate. Encephalopathy. EXAM: CT HEAD WITHOUT CONTRAST TECHNIQUE: Contiguous axial images were obtained from the base of the skull through the vertex without intravenous contrast. COMPARISON:  None. FINDINGS: Brain: There is no evidence of acute intracranial hemorrhage, mass lesion, brain edema or extra-axial fluid collection. Atrophy with prominence of the ventricles and subarachnoid spaces, especially in the temporal lobes. Relatively mild chronic small vessel ischemic changes in the periventricular white matter. There is no CT evidence of acute cortical infarction. Vascular: Prominent intracranial vascular calcifications. No hyperdense vessel identified. Skull: Negative for fracture or focal lesion. Sinuses/Orbits: The visualized paranasal sinuses and mastoid air cells are clear. No orbital abnormalities are seen. Other: None. IMPRESSION: 1. No acute intracranial findings. 2. Moderate atrophy and mild chronic small vessel ischemic changes. Electronically Signed   By: Richardean Sale M.D.   On: 05/05/2019 20:27   DG Chest Port 1 View  Result Date: 04/17/2019 CLINICAL DATA:  Shortness of breath. EXAM: PORTABLE CHEST 1 VIEW COMPARISON:   April 15, 2019 FINDINGS: The heart, hila, and mediastinum are unchanged. Diffuse bilateral pulmonary opacities with significant interstitial components and Kerley B lines laterally on the right. No other abnormalities. No pneumothorax. IMPRESSION: Pulmonary opacities are favored to represent pulmonary edema. Atypical infection considered less likely but not excluded. Electronically Signed   By: Dorise Bullion III M.D   On: 04/17/2019 11:23   DG Chest Portable 1 View  Result Date: 04/16/2019 CLINICAL DATA:  Weakness EXAM: PORTABLE CHEST 1 VIEW COMPARISON:  None. FINDINGS: Heart is upper limits normal in size. Patchy airspace opacities in the left upper lobe and peripherally in the left mid and lower lung. Right lung clear. No effusions or acute bony abnormality. IMPRESSION: Patchy predominantly peripheral airspace disease in the left lung concerning for pneumonia. Electronically Signed   By: Rolm Baptise M.D.   On: 04/17/2019 19:50   ECHOCARDIOGRAM COMPLETE  Result Date: 04/17/2019    ECHOCARDIOGRAM REPORT   Patient Name:   JOLANE BANKHEAD Date of Exam: 04/17/2019 Medical Rec #:  [id number]        Height:       66.0 in Accession #:    [id number]       Weight:       169.1 lb Date of Birth:  Jun 04, 1934         BSA:          1.862 m Patient Age:    18 years         BP:           103/48 mmHg Patient Gender: F                HR:           106 bpm. Exam Location:  [facility] Procedure: 2D Echo Indications:     DYSPNEA 786.09/R06.00  History:         Patient has no prior history of Echocardiogram examinations.                  Prior CABG, Arrythmias:Atrial Fibrillation,                  Signs/Symptoms:Dyspnea; Risk Factors:Hypertension.  Sonographer:     Avanell Shackleton Referring Phys:  [id number] ENIDPOE Maisha Bogen Diagnosing Phys: Kate Sable MD IMPRESSIONS  1. Left ventricular ejection fraction, by estimation, is 40 to 45%. The left ventricle has  mildly decreased function. The left ventricle demonstrates global hypokinesis.  There is mild left ventricular hypertrophy. Left ventricular diastolic parameters are indeterminate.  2. Right ventricular systolic function is normal. The right ventricular size is normal. There is severely elevated pulmonary artery systolic pressure.  3. Left atrial size was moderately dilated.  4. The mitral valve is grossly normal. No evidence of mitral valve regurgitation.  5. The aortic valve was not well visualized. Aortic valve regurgitation is not visualized.  6. The inferior vena cava is normal in size with <50% respiratory variability, suggesting right atrial pressure of 8 mmHg. FINDINGS  Left Ventricle: Left ventricular ejection fraction, by estimation, is 40 to 45%. The left ventricle has mildly decreased function. The left ventricle demonstrates global hypokinesis. The left ventricular internal cavity size was normal in size. There is  mild left ventricular hypertrophy. Left ventricular diastolic parameters are indeterminate. Right Ventricle: The right ventricular size is normal. Right vetricular wall thickness was not assessed. Right ventricular systolic function is normal. There is severely elevated pulmonary artery systolic pressure. The tricuspid regurgitant velocity is 4.01 m/s, and with an assumed right atrial pressure of 8 mmHg, the estimated right ventricular systolic pressure is 41.9 mmHg. Left Atrium: Left atrial size was moderately dilated. Right Atrium: Right atrial size was normal in size. Pericardium: There is no evidence of pericardial effusion. Mitral Valve: The mitral valve is grossly normal. Mild mitral annular calcification. No evidence of mitral valve regurgitation. Tricuspid Valve: The tricuspid valve is grossly normal. Tricuspid valve regurgitation is mild. Aortic Valve: The aortic valve was not well visualized. Aortic valve regurgitation is not visualized. Pulmonic Valve: The pulmonic valve was not well visualized. Pulmonic valve regurgitation is not visualized. Aorta: The aortic  root is normal in size and structure. Venous: The inferior vena cava is normal in size with less than 50% respiratory variability, suggesting right atrial pressure of 8 mmHg. IAS/Shunts: No atrial level shunt detected by color flow Doppler.  LEFT VENTRICLE PLAX 2D LVIDd:         4.85 cm LVIDs:         3.05 cm LV PW:         1.33 cm LV IVS:        1.65 cm LVOT diam:     2.20 cm LVOT Area:     3.80 cm  RIGHT VENTRICLE RV Mid diam:    3.97 cm LEFT ATRIUM             Index LA diam:        4.70 cm 2.52 cm/m LA Vol (A2C):   95.5 ml 51.29 ml/m LA Vol (A4C):   81.7 ml 43.87 ml/m LA Biplane Vol: 89.3 ml 47.96 ml/m   AORTA Ao Root diam: 3.60 cm MITRAL VALVE                TRICUSPID VALVE MV Area (PHT): 4.44 cm     TR Peak grad:   64.3 mmHg MV Decel Time: 171 msec     TR Vmax:        401.00 cm/s MV E velocity: 163.00 cm/s MV A velocity: 153.00 cm/s  SHUNTS MV E/A ratio:  1.07         Systemic Diam: 2.20 cm Kate Sable MD Electronically signed by Kate Sable MD Signature Date/Time: 04/17/2019/1:53:47 PM    Final     Scheduled Meds: . aspirin EC  81 mg Oral Daily  . digoxin  0.0625 mg Intravenous Q48H  . diphenhydrAMINE  12.5 mg Oral Once  . guaiFENesin  600 mg Oral BID  . heparin injection (subcutaneous)  5,000 Units Subcutaneous Q8H  . insulin aspart  0-5 Units Subcutaneous QHS  . insulin aspart  0-9 Units Subcutaneous TID WC  . insulin glargine  10 Units Subcutaneous QHS  . metoprolol tartrate  5 mg Intravenous Q6H  . sevelamer carbonate  800 mg Oral TID WC  . simvastatin  20 mg Oral QHS  . sodium bicarbonate  1,300 mg Oral TID   Continuous Infusions: . azithromycin 500 mg (04/16/19 2042)  . cefTRIAXone (ROCEPHIN)  IV 2 g (04/16/19 2147)     LOS: 2 days   Time spent: 40 minutes.  Lorella Nimrod, MD [facility]  If 7PM-7AM, please contact night-coverage [email or web address]  04/17/2019, 3:43 PM   This record has been created using Systems analyst. Errors have  been sought and corrected,but may not always be located. Such creation errors do not reflect on the standard of care.

## 2019-04-18 LAB — HEMOGLOBIN A1C
Hgb A1c MFr Bld: 7.3 % — ABNORMAL HIGH (ref 4.8–5.6)
Mean Plasma Glucose: 163 mg/dL

## 2019-04-18 LAB — STREP PNEUMONIAE URINARY ANTIGEN: Strep Pneumo Urinary Antigen: NEGATIVE

## 2019-04-19 LAB — HEMOGLOBIN A1C
Hgb A1c MFr Bld: 7.5 % — ABNORMAL HIGH (ref 4.8–5.6)
Mean Plasma Glucose: 169 mg/dL

## 2019-04-20 LAB — CULTURE, BLOOD (ROUTINE X 2)
Culture: NO GROWTH
Culture: NO GROWTH
Special Requests: ADEQUATE
Special Requests: ADEQUATE

## 2019-05-07 NOTE — Progress Notes (Signed)
RT done with NP suctioning.  Pt sounds a little better but still has an audible rattle.  O2 sat is 100% on 2 liters [place].  Pt appears comfortable and resting with eyes closed.

## 2019-05-07 NOTE — Progress Notes (Signed)
Family came to visit the body.  Chaplain came to visit the family.  Post mortem care done by myself and CNA.  Orderly called for transport to morgue.

## 2019-05-07 NOTE — Progress Notes (Signed)
Pt passed away at 0235.  Son, Coralyn Mark, notified and family are OTW.  NP Sharion Settler up to fill out death certificate after assessing that pt had indeed passed away.  Chaplain on call notified and OTW.

## 2019-05-07 NOTE — Discharge Summary (Cosign Needed Addendum)
  Ms Katrina Chambers is 85 year old African-American femalewith a known history of type 2 diabetes mellitus and chronic kidney disease, systolic heart fialurewho presented to the emergency room with acute onset of generalized weakness and increasing confusion. She was  also noted to have advanced dementia and had developed symptoms consistant with failure to thrive over past several weeks per HPI.   Upon presentation she was diagnosed with CAP.  She continued to display poor oral intake, difficulties swallowing and weakness and increased heart failure symptoms with worsening metabolic acidosis.  Code status was DNR/DNI. She had displayed tachyarrhythmias since admission.  She went into v tach around 0230 that progress to ventricular fibrillation then asystole approx 0240.  Death confirmed by exam, no neuro response, nor pulse no respirations.  Son was informed over the phone by RN

## 2019-05-07 NOTE — Progress Notes (Signed)
RT called for NP suctioning.  Pt unable to cough up secretions and holds mouth shut tight if attempting to use yaunker.

## 2019-05-07 NOTE — Progress Notes (Signed)
  Ms Skiver is 85 year old African-American femalewith a known history of type 2 diabetes mellitus and chronic kidney disease, systolic heart fialurewho presented to the emergency room with acute onset of generalized weakness and increasing confusion. She was  also noted to have advanced dementia and had developed symptoms consistant with failure to thrive over past several weeks per HPI.   Upon presentation she was diagnosed with CAP.  She continued to display poor oral intake, difficulties swallowing and weakness and increased heart failure symptoms with worsening metabolic acidosis.  Code status was DNR/DNI. She had displayed tachyarrhythmias since admission.  She went into v tach around 0230 that progress to ventricular fibrillation then asystole approx 0240.  Death confirmed by exam, no neuro response, nor pulse no respirations.  Son was informed over the phone by RN

## 2019-05-07 NOTE — Progress Notes (Signed)
Transporter came and got the body and took it down to the morgue.

## 2019-05-07 NOTE — Progress Notes (Addendum)
   2019-04-19 0247  Clinical Encounter Type  Visited With Family  Visit Type Initial;Death  Referral From Nurse  Consult/Referral To Chaplain  Chaplain received page at 2:47 AM notifying her of the death of Katrina Chambers. Chaplain immediately went to the room and was told family was on their way. Chaplain sat in hall waiting for one hour, then asked nurse to call once family arrived. Chaplain received second page at 4:08 AM, family was here. Chaplain entered the room and greeted two sons, daughter-in-law, and one grandson. Everyone was appropriate grieving. Family stayed in the room for about twenty minutes. Chaplain prayed with family before they left. Chaplain escorted them to nurses' station to complete release form.

## 2019-05-07 NOTE — Death Summary Note (Signed)
Death Summary  Katrina Chambers TOI:[id number] DOB: 10-Oct-1934 DOA: May 06, 2019  PCP: [facility] [place] date: May 06, 2019 Date of Death: 09-May-2019 Time of Death: 2:40 AM. Notification: [facility] [place] notified of death of 05-09-2019   History of present illness:  Katrina Chambers is a 85 y.o. female with a history of type 2 diabetes mellitus , advanced dementia, HFrEF, and chronic kidney disease, who presented to the emergency room with acute onset of generalized weakness and increasing confusion. She was not able to ambulate from generalized weakness today.The patient did not have any reported fever or chills, nausea or vomiting.  Talked with son and for the past few weeks she was going down the hill, unable to eat, require help with all her ADLs.  She does not recognize where she is and her loved ones.  She has a long standing diagnosis of dementia which is worsening now.  Patient remains nonverbal, minimally responsive, just moaning and not taking any p.o. intake. [place] did not improve after treating her presumed pneumonia with ceftriaxone and azithromycin.  She did developed tachyarrhythmias which seems chronic with no documentation.  Cardiology was involved.  Patient also had HFrEF.  She continued to deteriorate despite supportive cares and antibiotics.  Her acidosis and kidney function continued to get worse.  She was not a candidate for dialysis. She developed ventricular fibrillation followed by arrest.  Patient was DNR and pronounced death around 2:40 AM.  Final Diagnoses:  1.   HFrEF with tachyarrhythmias 2.  Advanced dementia 3.  Failure to thrive. 4.  CKD stage IV. 5.  Metabolic acidosis.   The results of significant diagnostics from this hospitalization (including imaging, microbiology, ancillary and laboratory) are listed below for reference.    Significant Diagnostic Studies: CT Head Wo Contrast  Result Date: 05/06/2019 CLINICAL DATA:  New onset  of weakness and incontinence. Unable to ambulate. Encephalopathy. EXAM: CT HEAD WITHOUT CONTRAST TECHNIQUE: Contiguous axial images were obtained from the base of the skull through the vertex without intravenous contrast. COMPARISON:  None. FINDINGS: Brain: There is no evidence of acute intracranial hemorrhage, mass lesion, brain edema or extra-axial fluid collection. Atrophy with prominence of the ventricles and subarachnoid spaces, especially in the temporal lobes. Relatively mild chronic small vessel ischemic changes in the periventricular white matter. There is no CT evidence of acute cortical infarction. Vascular: Prominent intracranial vascular calcifications. No hyperdense vessel identified. Skull: Negative for fracture or focal lesion. Sinuses/Orbits: The visualized paranasal sinuses and mastoid air cells are clear. No orbital abnormalities are seen. Other: None. IMPRESSION: 1. No acute intracranial findings. 2. Moderate atrophy and mild chronic small vessel ischemic changes. Electronically Signed   By: Richardean Sale M.D.   On: May 06, 2019 20:27   DG Chest Port 1 View  Result Date: 04/17/2019 CLINICAL DATA:  Shortness of breath. EXAM: PORTABLE CHEST 1 VIEW COMPARISON:  05/06/19 FINDINGS: The heart, hila, and mediastinum are unchanged. Diffuse bilateral pulmonary opacities with significant interstitial components and Kerley B lines laterally on the right. No other abnormalities. No pneumothorax. IMPRESSION: Pulmonary opacities are favored to represent pulmonary edema. Atypical infection considered less likely but not excluded. Electronically Signed   By: Dorise Bullion III M.D   On: 04/17/2019 11:23   DG Chest Portable 1 View  Result Date: 06-May-2019 CLINICAL DATA:  Weakness EXAM: PORTABLE CHEST 1 VIEW COMPARISON:  None. FINDINGS: Heart is upper limits normal in size. Patchy airspace opacities in the left upper lobe and peripherally in  the left mid and lower lung. Right lung clear. No effusions  or acute bony abnormality. IMPRESSION: Patchy predominantly peripheral airspace disease in the left lung concerning for pneumonia. Electronically Signed   By: Rolm Baptise M.D.   On: 04/29/2019 19:50   ECHOCARDIOGRAM COMPLETE  Result Date: 04/17/2019    ECHOCARDIOGRAM REPORT   Patient Name:   Katrina Chambers Date of Exam: 04/17/2019 Medical Rec #:  [id number]        Height:       66.0 in Accession #:    [id number]       Weight:       169.1 lb Date of Birth:  1934-12-26         BSA:          1.862 m Patient Age:    54 years         BP:           103/48 mmHg Patient Gender: F                HR:           106 bpm. Exam Location:  [facility] Procedure: 2D Echo Indications:     DYSPNEA 786.09/R06.00  History:         Patient has no prior history of Echocardiogram examinations.                  Prior CABG, Arrythmias:Atrial Fibrillation,                  Signs/Symptoms:Dyspnea; Risk Factors:Hypertension.  Sonographer:     Avanell Shackleton Referring Phys:  [id number] Avrianna Smart Diagnosing Phys: Kate Sable MD IMPRESSIONS  1. Left ventricular ejection fraction, by estimation, is 40 to 45%. The left ventricle has mildly decreased function. The left ventricle demonstrates global hypokinesis. There is mild left ventricular hypertrophy. Left ventricular diastolic parameters are indeterminate.  2. Right ventricular systolic function is normal. The right ventricular size is normal. There is severely elevated pulmonary artery systolic pressure.  3. Left atrial size was moderately dilated.  4. The mitral valve is grossly normal. No evidence of mitral valve regurgitation.  5. The aortic valve was not well visualized. Aortic valve regurgitation is not visualized.  6. The inferior vena cava is normal in size with <50% respiratory variability, suggesting right atrial pressure of 8 mmHg. FINDINGS  Left Ventricle: Left ventricular ejection fraction, by estimation, is 40 to 45%. The left ventricle has mildly decreased function. The left  ventricle demonstrates global hypokinesis. The left ventricular internal cavity size was normal in size. There is  mild left ventricular hypertrophy. Left ventricular diastolic parameters are indeterminate. Right Ventricle: The right ventricular size is normal. Right vetricular wall thickness was not assessed. Right ventricular systolic function is normal. There is severely elevated pulmonary artery systolic pressure. The tricuspid regurgitant velocity is 4.01 m/s, and with an assumed right atrial pressure of 8 mmHg, the estimated right ventricular systolic pressure is 46.9 mmHg. Left Atrium: Left atrial size was moderately dilated. Right Atrium: Right atrial size was normal in size. Pericardium: There is no evidence of pericardial effusion. Mitral Valve: The mitral valve is grossly normal. Mild mitral annular calcification. No evidence of mitral valve regurgitation. Tricuspid Valve: The tricuspid valve is grossly normal. Tricuspid valve regurgitation is mild. Aortic Valve: The aortic valve was not well visualized. Aortic valve regurgitation is not visualized. Pulmonic Valve: The pulmonic valve was not well visualized. Pulmonic valve regurgitation is not visualized. Aorta: The aortic  root is normal in size and structure. Venous: The inferior vena cava is normal in size with less than 50% respiratory variability, suggesting right atrial pressure of 8 mmHg. IAS/Shunts: No atrial level shunt detected by color flow Doppler.  LEFT VENTRICLE PLAX 2D LVIDd:         4.85 cm LVIDs:         3.05 cm LV PW:         1.33 cm LV IVS:        1.65 cm LVOT diam:     2.20 cm LVOT Area:     3.80 cm  RIGHT VENTRICLE RV Mid diam:    3.97 cm LEFT ATRIUM             Index LA diam:        4.70 cm 2.52 cm/m LA Vol (A2C):   95.5 ml 51.29 ml/m LA Vol (A4C):   81.7 ml 43.87 ml/m LA Biplane Vol: 89.3 ml 47.96 ml/m   AORTA Ao Root diam: 3.60 cm MITRAL VALVE                TRICUSPID VALVE MV Area (PHT): 4.44 cm     TR Peak grad:   64.3 mmHg  MV Decel Time: 171 msec     TR Vmax:        401.00 cm/s MV E velocity: 163.00 cm/s MV A velocity: 153.00 cm/s  SHUNTS MV E/A ratio:  1.07         Systemic Diam: 2.20 cm Kate Sable MD Electronically signed by Kate Sable MD Signature Date/Time: 04/17/2019/1:53:47 PM    Final     Microbiology: Recent Results (from the past 240 hour(s))  Blood culture (routine x 2)     Status: None (Preliminary result)   Collection Time: 04/12/2019  8:40 PM   Specimen: BLOOD  Result Value Ref Range Status   Specimen Description BLOOD RIGHT ANTECUBITAL  Final   Special Requests   Final    BOTTLES DRAWN AEROBIC AND ANAEROBIC Blood Culture adequate volume   Culture   Final    NO GROWTH 3 DAYS Performed at [facility], [address] [place], [place] [address]    Report Status PENDING  Incomplete  Blood culture (routine x 2)     Status: None (Preliminary result)   Collection Time: 04/21/2019  8:40 PM   Specimen: BLOOD  Result Value Ref Range Status   Specimen Description BLOOD LEFT ANTECUBITAL  Final   Special Requests   Final    BOTTLES DRAWN AEROBIC AND ANAEROBIC Blood Culture adequate volume   Culture   Final    NO GROWTH 3 DAYS Performed at [facility], [address] [place], [place] [address]    Report Status PENDING  Incomplete  Respiratory Panel by RT PCR (Flu A&B, Covid) - Nasopharyngeal Swab     Status: None   Collection Time: 05/03/2019  8:40 PM   Specimen: Nasopharyngeal Swab  Result Value Ref Range Status   SARS Coronavirus 2 by RT PCR NEGATIVE NEGATIVE Final    Comment: (NOTE) SARS-CoV-2 target nucleic acids are NOT DETECTED. The SARS-CoV-2 RNA is generally detectable in upper respiratoy specimens during the acute phase of infection. The lowest concentration of SARS-CoV-2 viral copies this assay can detect is 131 copies/mL. A negative result does not preclude SARS-Cov-2 infection and should not be used as the sole basis for treatment or other patient  management decisions. A negative result may occur with  improper  specimen collection/handling, submission of specimen other than nasopharyngeal swab, presence of viral mutation(s) within the areas targeted by this assay, and inadequate number of viral copies (<131 copies/mL). A negative result must be combined with clinical observations, patient history, and epidemiological information. The expected result is Negative. Fact Sheet for Patients:  PinkCheek.be Fact Sheet for Healthcare Providers:  GravelBags.it This test is not yet ap proved or cleared by the [place] FDA and  has been authorized for detection and/or diagnosis of SARS-CoV-2 by FDA under an Emergency Use Authorization (EUA). This EUA will remain  in effect (meaning this test can be used) for the duration of the COVID-19 declaration under Section 564(b)(1) of the Act, 21 U.S.C. section 360bbb-3(b)(1), unless the authorization is terminated or revoked sooner.    Influenza A by PCR NEGATIVE NEGATIVE Final   Influenza B by PCR NEGATIVE NEGATIVE Final    Comment: (NOTE) The Xpert Xpress SARS-CoV-2/FLU/RSV assay is intended as an aid in  the diagnosis of influenza from Nasopharyngeal swab specimens and  should not be used as a sole basis for treatment. Nasal washings and  aspirates are unacceptable for Xpert Xpress SARS-CoV-2/FLU/RSV  testing. Fact Sheet for Patients: PinkCheek.be Fact Sheet for Healthcare Providers: GravelBags.it This test is not yet approved or cleared by the [place] FDA and  has been authorized for detection and/or diagnosis of SARS-CoV-2 by  FDA under an Emergency Use Authorization (EUA). This EUA will remain  in effect (meaning this test can be used) for the duration of the  Covid-19 declaration under Section 564(b)(1) of the Act, 21  U.S.C. section 360bbb-3(b)(1), unless the  authorization is  terminated or revoked. Performed at [facility], [address] [place], [place] [address]      Labs: Basic Metabolic Panel: Recent Labs  Lab 04/27/2019 1849 04/17/2019 1849 04/16/19 0006 04/16/19 0553 04/17/19 0606  NA 139  --   --  142 145  K 4.3   < >  --  4.2 4.5  CL 113*  --   --  116* 119*  CO2 16*  --   --  16* 13*  GLUCOSE 321*  --   --  310* 424*  BUN 64*  --   --  61* 66*  CREATININE 2.63*  --   --  2.66* 3.03*  CALCIUM 8.3*  --   --  8.3* 8.0*  MG  --   --  1.8  --   --   PHOS  --   --  4.8*  --   --    < > = values in this interval not displayed.   Liver Function Tests: No results for input(s): AST, ALT, ALKPHOS, BILITOT, PROT, ALBUMIN in the last 168 hours. No results for input(s): LIPASE, AMYLASE in the last 168 hours. No results for input(s): AMMONIA in the last 168 hours. CBC: Recent Labs  Lab 04/28/2019 1849 04/16/19 0553 04/17/19 0606  WBC 8.3 8.3 9.0  HGB 8.7* 8.4* 9.0*  HCT 29.4* 27.6* 31.2*  MCV 98.3 95.8 101.6*  PLT 138* 132* 142*   Cardiac Enzymes: No results for input(s): CKTOTAL, CKMB, CKMBINDEX, TROPONINI in the last 168 hours. D-Dimer No results for input(s): DDIMER in the last 72 hours. BNP: Invalid input(s): POCBNP CBG: Recent Labs  Lab 04/16/19 2154 04/17/19 1648 04/17/19 2057  GLUCAP 350* 372* 338*   Anemia work up No results for input(s): VITAMINB12, FOLATE, FERRITIN, TIBC, IRON, RETICCTPCT in the last 72 hours. Urinalysis    Component  Value Date/Time   COLORURINE YELLOW (A) 04/08/2019 1936   APPEARANCEUR CLEAR (A) 04/13/2019 1936   LABSPEC 1.012 04/29/2019 1936   PHURINE 5.0 04/09/2019 1936   GLUCOSEU NEGATIVE 04/17/2019 [place] NEGATIVE 05/05/2019 [place] NEGATIVE 04/25/2019 [place] 04/23/2019 [place] NEGATIVE 04/10/2019 1936   NITRITE NEGATIVE 04/10/2019 1936   LEUKOCYTESUR NEGATIVE 04/11/2019 1936   Sepsis Labs Invalid input(s):  PROCALCITONIN,  WBC,  LACTICIDVEN  SIGNED:  Lorella Nimrod, MD  [facility] 2019-04-28, 7:23 AM Pager   If 7PM-7AM, please contact night-coverage [email or web address] Password [email or web address]  This record has been created using Systems analyst. Errors have been sought and corrected,but may not always be located. Such creation errors do not reflect on the standard of care.

## 2019-05-07 NOTE — Progress Notes (Signed)
RT at bs.  Pt unable to move air.  RT performed NP suctioning and was able to get some mucous out.  Pt went into vtach and stopped breathing altogether. Vfib followed by flat line asystole.   By 6546, pt had no vital signs and time of death is called.

## 2019-05-07 NOTE — Progress Notes (Signed)
RT notified of need for suctioning.  NP on call notified that pt lungs rattle and pt will not let me suction her with yaunkers.  Pt does not follow commands and will not cough when asked to do so.  RT in at bedside now.

## 2019-05-07 DEATH — deceased

## 2021-09-07 IMAGING — CT CT HEAD W/O CM
3 series · 15 of 47 positions shown, 18 images · non-contrast
Comparison: None.

CLINICAL DATA: New onset of weakness and incontinence. Unable to
ambulate. Encephalopathy.

EXAM:
CT HEAD WITHOUT CONTRAST
TECHNIQUE: Contiguous axial images were obtained from the base of the skull
through the vertex without intravenous contrast.

[Series 2: head wo · axial · 0.43mm/px · z∈[-111,+14]mm · 9 of 30 slices shown, 12 images]
[im 3/30  brain]
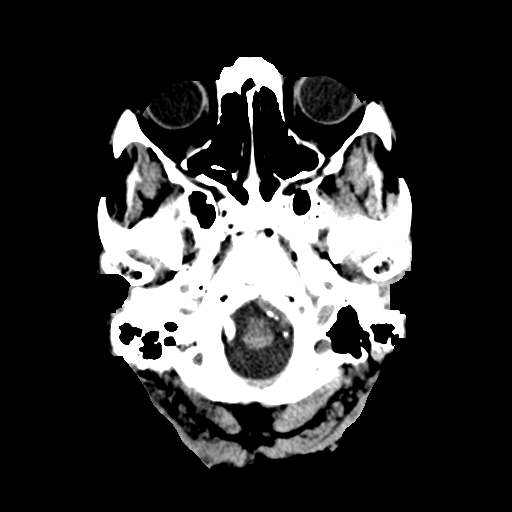
[im 3/30  bone]
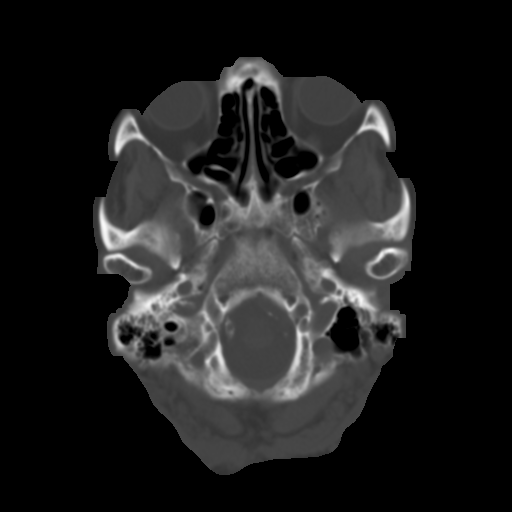
[im 6/30  brain]
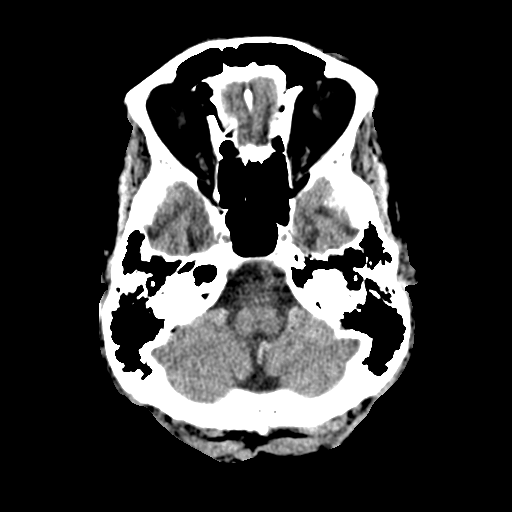
[im 9/30  brain]
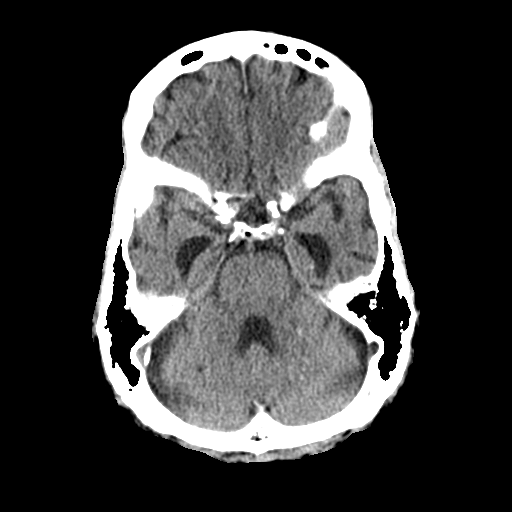
[im 12/30  brain]
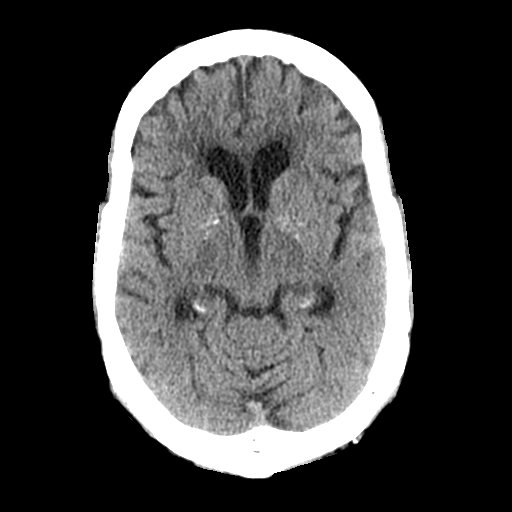
[im 16/30  brain]
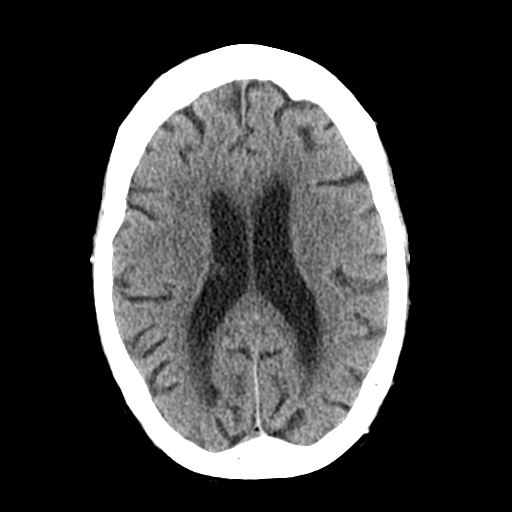
[im 16/30  bone]
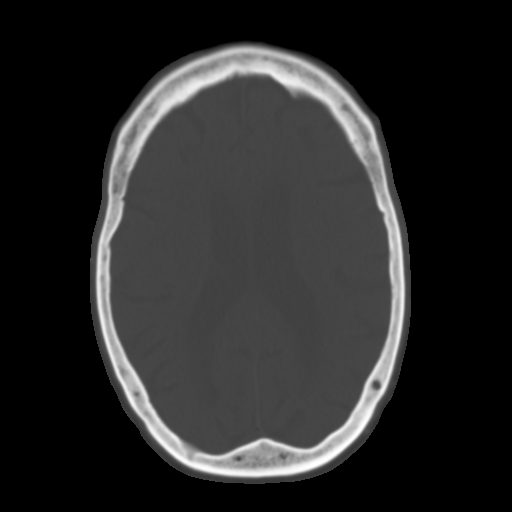
[im 19/30  brain]
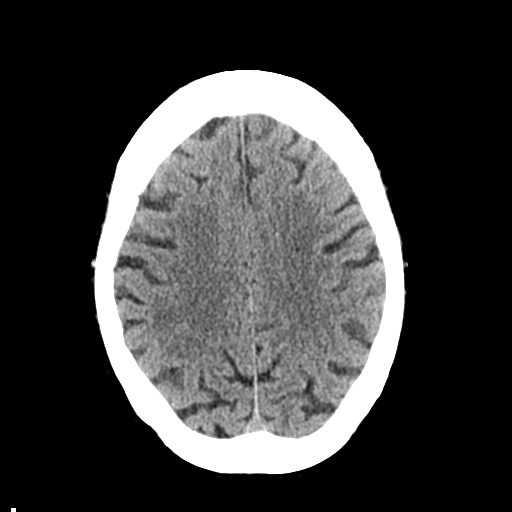
[im 22/30  brain]
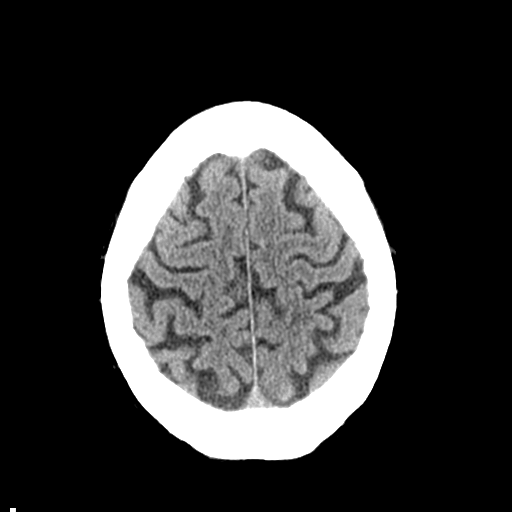
[im 25/30  brain]
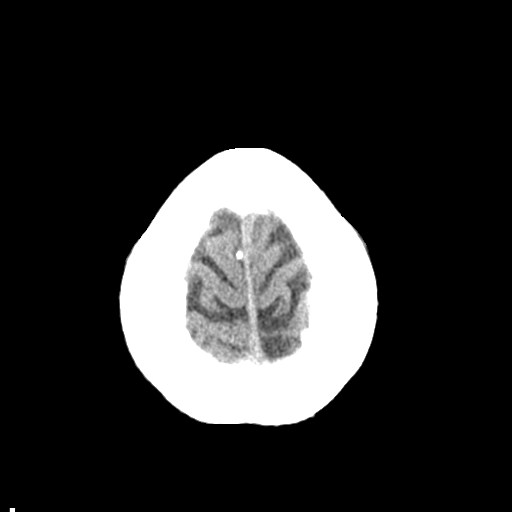
[im 28/30  brain]
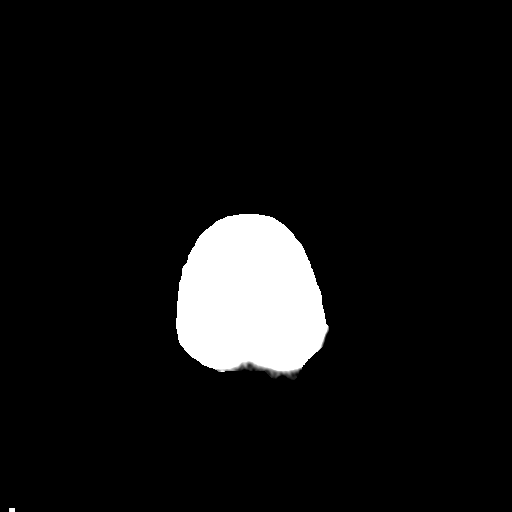
[im 28/30  bone]
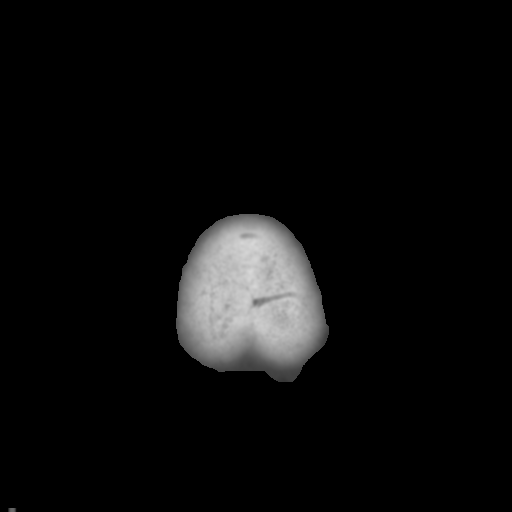

[Series 4: coronal soft tissue · coronal · 0.33mm/px · 3 of 67 slices shown]
[im 23/67  brain]
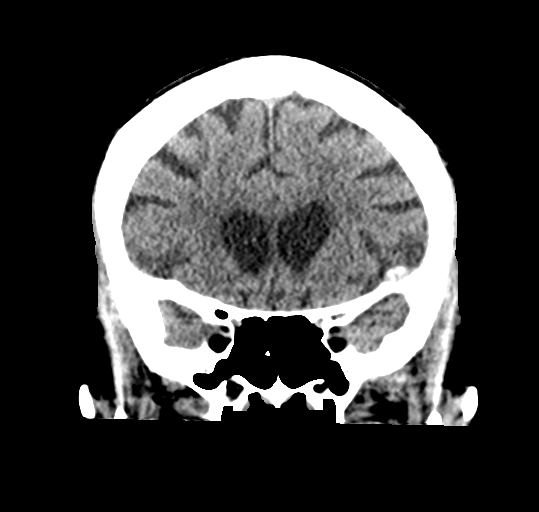
[im 30/67  brain]
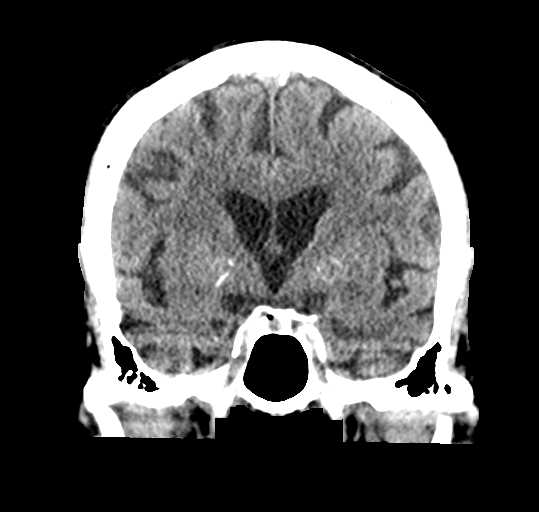
[im 37/67  brain]
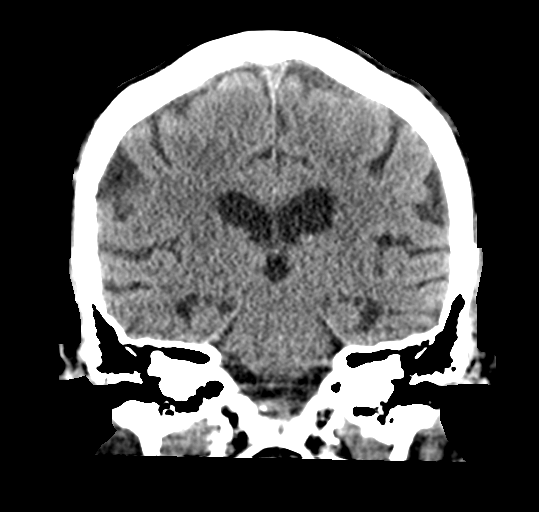

[Series 5: sagittal soft tissue · sagittal · 0.35mm/px · 3 of 51 slices shown]
[im 17/51  brain]
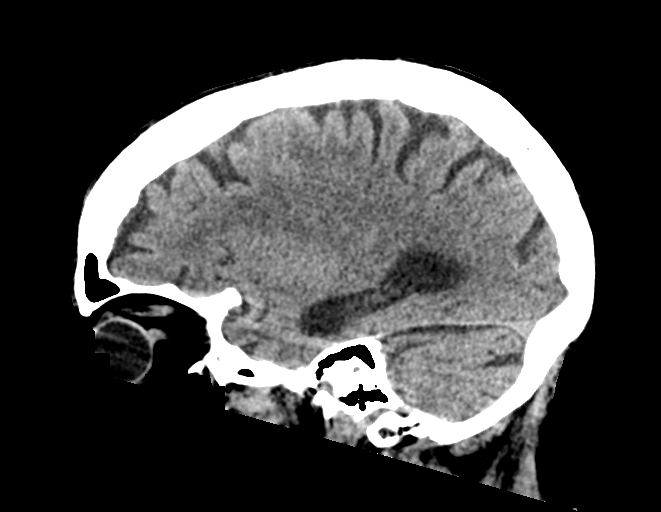
[im 26/51  brain]
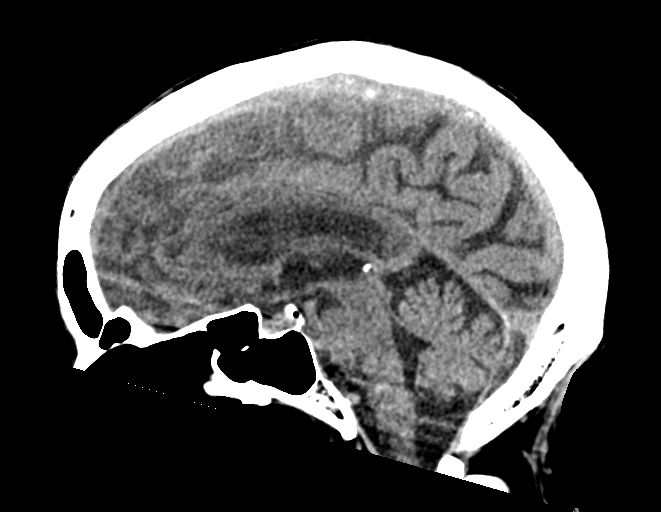
[im 34/51  brain]
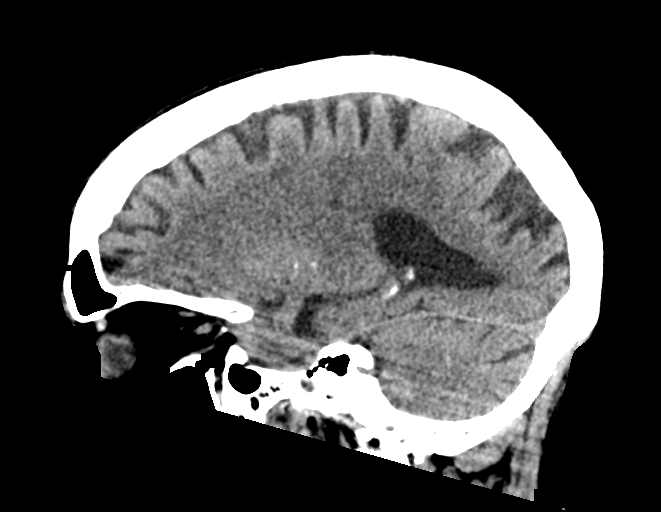

[15 of 47 positions shown; findings below may reference images not displayed]

FINDINGS: Brain: There is no evidence of acute intracranial hemorrhage, mass
lesion, brain edema or extra-axial fluid collection. Atrophy with
prominence of the ventricles and subarachnoid spaces, especially in
the temporal lobes. Relatively mild chronic small vessel ischemic
changes in the periventricular white matter. There is no CT evidence
of acute cortical infarction.

Vascular: Prominent intracranial vascular calcifications. No
hyperdense vessel identified.

Skull: Negative for fracture or focal lesion.

Sinuses/Orbits: The visualized paranasal sinuses and mastoid air
cells are clear. No orbital abnormalities are seen.

Other: None.
IMPRESSION: 1. No acute intracranial findings.
2. Moderate atrophy and mild chronic small vessel ischemic changes.

## 2021-09-09 IMAGING — DX DG CHEST 1V PORT
1 series · 1 of 1 positions shown · non-contrast
Comparison: April 15, 2019

CLINICAL DATA: Shortness of breath.

EXAM:
PORTABLE CHEST 1 VIEW

[chest ap]
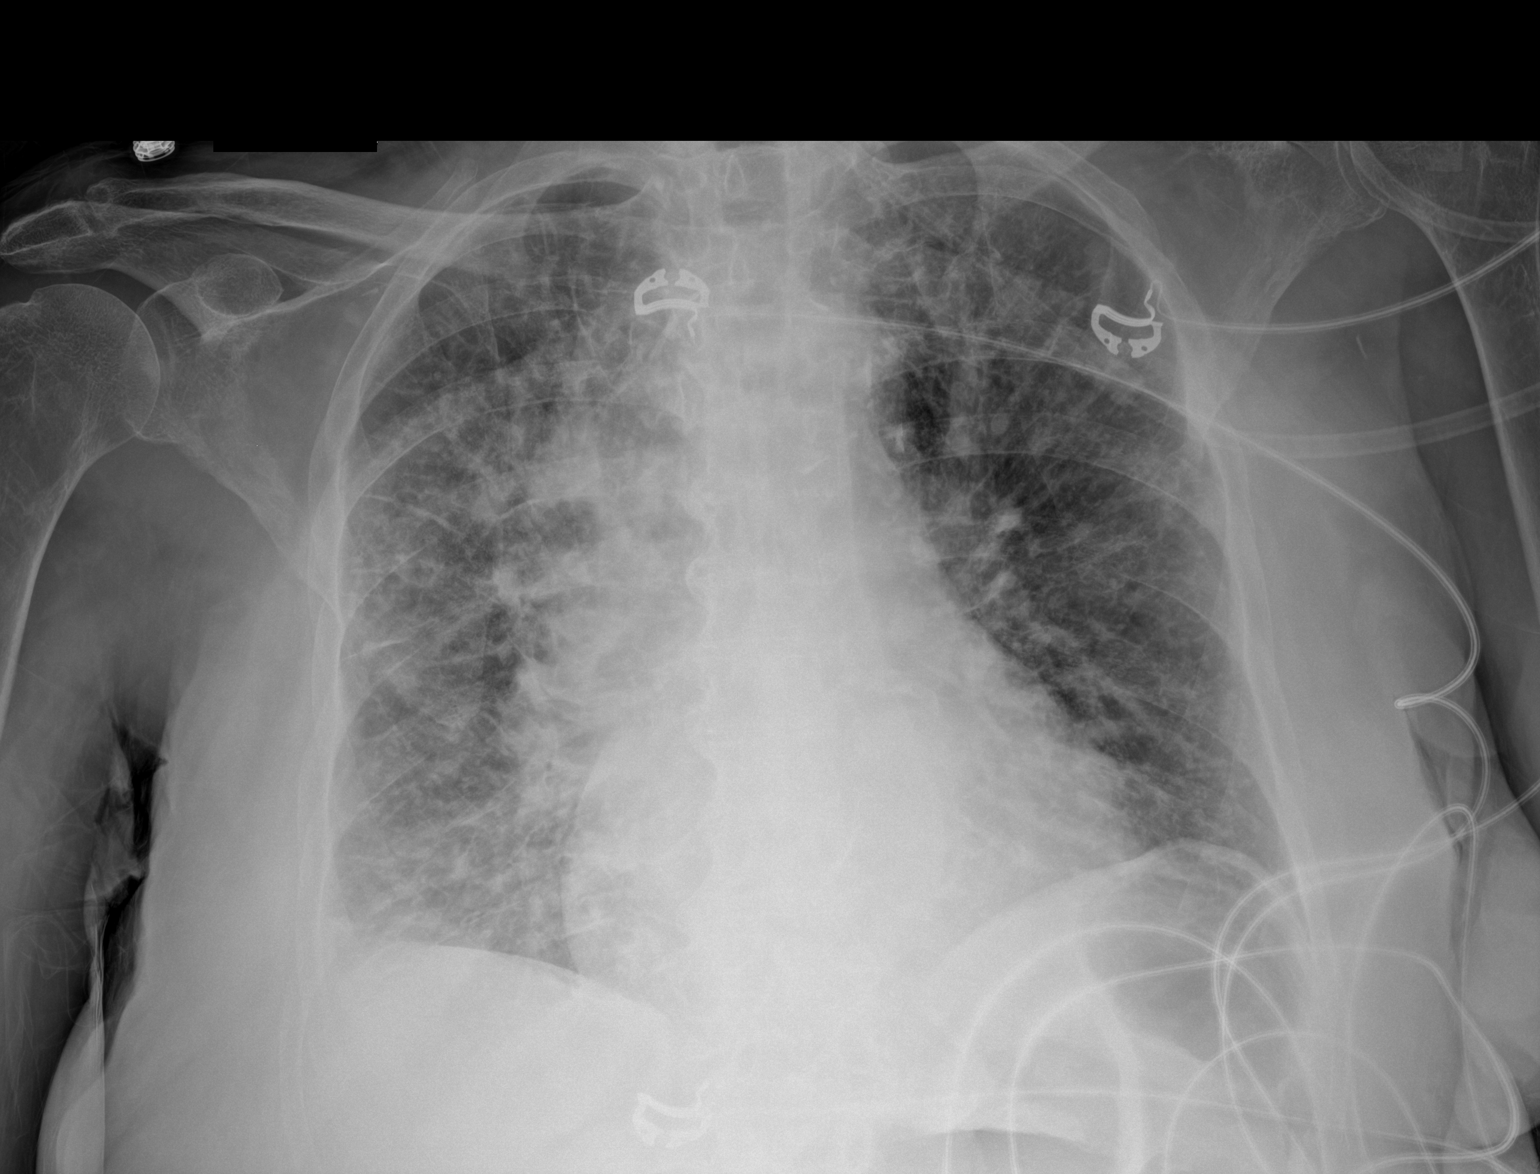

[1 of 1 positions shown; findings below may reference images not displayed]

FINDINGS: The heart, hila, and mediastinum are unchanged. Diffuse bilateral
pulmonary opacities with significant interstitial components and
Kerley B lines laterally on the right. No other abnormalities. No
pneumothorax.
IMPRESSION: Pulmonary opacities are favored to represent pulmonary edema.
Atypical infection considered less likely but not excluded.
